# Patient Record
Sex: Male | Born: 1945
Health system: Southern US, Community
[De-identification: ages and names within clinical notes are randomized; demographics above are authoritative.]

## PROBLEM LIST (undated history)

## (undated) DIAGNOSIS — E669 Obesity, unspecified: Secondary | ICD-10-CM

## (undated) DIAGNOSIS — K219 Gastro-esophageal reflux disease without esophagitis: Secondary | ICD-10-CM

## (undated) DIAGNOSIS — E079 Disorder of thyroid, unspecified: Secondary | ICD-10-CM

## (undated) DIAGNOSIS — I2699 Other pulmonary embolism without acute cor pulmonale: Secondary | ICD-10-CM

---

## 2000-02-01 ENCOUNTER — Encounter (INDEPENDENT_AMBULATORY_CARE_PROVIDER_SITE_OTHER): Payer: Self-pay | Admitting: Specialist

## 2000-02-01 ENCOUNTER — Encounter: Payer: Self-pay | Admitting: General Surgery

## 2000-02-01 ENCOUNTER — Encounter: Payer: Self-pay | Admitting: *Deleted

## 2000-02-01 ENCOUNTER — Observation Stay (HOSPITAL_COMMUNITY): Admission: RE | Admit: 2000-02-01 | Discharge: 2000-02-02 | Payer: Self-pay | Admitting: General Surgery

## 2010-10-09 ENCOUNTER — Ambulatory Visit
Admission: RE | Admit: 2010-10-09 | Discharge: 2010-10-15 | Payer: Self-pay | Source: Home / Self Care | Attending: Radiation Oncology | Admitting: Radiation Oncology

## 2010-10-12 ENCOUNTER — Encounter
Admission: RE | Admit: 2010-10-12 | Discharge: 2010-10-12 | Payer: Self-pay | Source: Home / Self Care | Attending: Urology | Admitting: Urology

## 2010-10-23 ENCOUNTER — Ambulatory Visit (HOSPITAL_BASED_OUTPATIENT_CLINIC_OR_DEPARTMENT_OTHER)
Admission: RE | Admit: 2010-10-23 | Discharge: 2010-10-23 | Disposition: A | Discharge status: Home / Self Care | Payer: BC Managed Care – PPO | Source: Ambulatory Visit | Attending: Urology | Admitting: Urology

## 2010-10-23 DIAGNOSIS — Z01810 Encounter for preprocedural cardiovascular examination: Secondary | ICD-10-CM | POA: Insufficient documentation

## 2010-10-23 DIAGNOSIS — Z538 Procedure and treatment not carried out for other reasons: Secondary | ICD-10-CM | POA: Insufficient documentation

## 2010-12-08 LAB — PROTIME-INR
INR: 1 (ref 0.00–1.49)
Prothrombin Time: 13.4 seconds (ref 11.6–15.2)

## 2010-12-08 LAB — COMPREHENSIVE METABOLIC PANEL
BUN: 11 mg/dL (ref 6–23)
CO2: 28 mEq/L (ref 19–32)
Calcium: 9.1 mg/dL (ref 8.4–10.5)
Creatinine, Ser: 0.86 mg/dL (ref 0.4–1.5)
GFR calc Af Amer: >60 mL/min (ref >60)
GFR calc non Af Amer: >60 mL/min (ref >60)
Glucose, Bld: 96 mg/dL (ref 70–99)

## 2010-12-08 LAB — CBC
HCT: 46 % (ref 39.0–52.0)
Hemoglobin: 15.2 g/dL (ref 13.0–17.0)
MCH: 30.3 pg (ref 26.0–34.0)
MCHC: 33 g/dL (ref 30.0–36.0)
MCV: 91.8 fL (ref 78.0–100.0)
Platelets: 298 10*3/uL (ref 150–400)
RBC: 5.01 MIL/uL (ref 4.22–5.81)
RDW: 13.9 % (ref 11.5–15.5)
WBC: 8.1 10*3/uL (ref 4.0–10.5)

## 2010-12-08 LAB — APTT: aPTT: 32 seconds (ref 24–37)

## 2010-12-12 ENCOUNTER — Ambulatory Visit (HOSPITAL_COMMUNITY): Payer: BC Managed Care – PPO | Attending: Urology

## 2010-12-12 ENCOUNTER — Ambulatory Visit
Admission: RE | Admit: 2010-12-12 | Discharge: 2010-12-12 | Disposition: A | Discharge status: Home / Self Care | Payer: BC Managed Care – PPO | Source: Ambulatory Visit | Attending: Urology | Admitting: Urology

## 2010-12-12 ENCOUNTER — Ambulatory Visit: Payer: BC Managed Care – PPO | Attending: Radiation Oncology | Admitting: Radiation Oncology

## 2010-12-12 DIAGNOSIS — Z01812 Encounter for preprocedural laboratory examination: Secondary | ICD-10-CM | POA: Insufficient documentation

## 2010-12-12 DIAGNOSIS — Z79899 Other long term (current) drug therapy: Secondary | ICD-10-CM | POA: Insufficient documentation

## 2010-12-12 DIAGNOSIS — C61 Malignant neoplasm of prostate: Secondary | ICD-10-CM | POA: Insufficient documentation

## 2011-01-02 ENCOUNTER — Ambulatory Visit: Payer: BC Managed Care – PPO | Admitting: Radiation Oncology

## 2011-01-10 ENCOUNTER — Ambulatory Visit: Payer: BC Managed Care – PPO | Admitting: Radiation Oncology

## 2011-01-10 ENCOUNTER — Ambulatory Visit: Payer: BC Managed Care – PPO | Attending: Radiation Oncology | Admitting: Radiation Oncology

## 2011-01-14 ENCOUNTER — Ambulatory Visit: Payer: BC Managed Care – PPO | Admitting: Radiation Oncology

## 2011-02-01 NOTE — Op Note (Signed)
[facility]  Patient:    Patrick Osborn, Patrick Osborn                       MRN: [id number] Proc. Date: 02/01/00 Adm. Date:  [id number] Disc. Date: [id number] Attending:  Carson Myrtle                           Operative Report  PREOPERATIVE DIAGNOSIS:  Umbilical hernia incarcerated, chronic cholecystolithiasis.  POSTOPERATIVE DIAGNOSIS:  Umbilical hernia incarcerated, chronic cholecystolithiasis.  OPERATION PERFORMED:  Laparoscopic cholecystectomy and repair of umbilical hernia.  SURGEON:  Timothy E. Earlene Plater, M.D.  ASSISTANT:  Marnee Spring. Wiliam Ke, M.D.  ANESTHESIA:  CRNA supervised M.D.  INDICATION FOR PROCEDURE:  Mr. Hayhurst considers himself otherwise healthy at 38. He takes no medications of drugs. He has had a recent onset of mid epigastric and right upper quadrant pain. Ultrasound shows gallstones and laboratory data have all been normal. He has electively selected this time to proceed with lap chole which has been carefully explained. He also has an incarcerated umbilical hernia that not only needs repair but will be in the way of the laparoscopic cholecystectomy.  DESCRIPTION OF PROCEDURE:  The patient was brought to the operating room, placed supine, general endotracheal anesthesia administered. The abdomen was shaved, scrubbed, prepped and draped in the usual fashion. Marcaine 0.5% with epinephrine was injected at each trocar site. A large infraumbilical curvilinear incision made. I dissected the hernia superior, entered the fascia inferior and placed the Hasson catheter under direct vision without complications. This was tied in place and the abdomen was insufflated. General peritoneoscopy was unremarkable except for a very obese omentum. A second 10 mm trocar was placed in the mid epigastrium and two 5 mm trocars in the right upper quadrant. Appropriate instruments applied, the gallbladder grasped and placed on tension. It was a very fatty gallbladder  and partially intrahepatic. Careful dissection in the fatty portion of the lower part of the gallbladder very carefully and bluntly revealed a normal appearing cystic duct, a normal appearing cystic artery and the outlying of the posterior aspect of the gallbladder. The two structures were dissected out, triply clipped and divided and then the gallbladder was removed from the gallbladder bed without difficulty though 1 small hole was made in the posterior aspect of the gallbladder where it was intrahepatic. The bed was carefully controlled with cautery and the bed was dry. Irrigation was then clear. The gallbladder was then placed in a bag and then removed from the abdomen through the infraumbilical incision. Irrigation carried out until clear. All irrigation instruments and trocars removed. The infraumbilical incision was then expanded. The umbilical skin was dissected off the hernia defect, the hernia defect was reduced and the fascia was closed with interrupted #0 Prolene sutures. This closed both the hernia and the trocar site. That wound was dry as well. The skin was closed with 4-0 monocryl and Steri-Strips were applied. Counts correct. The patient tolerated the procedure well and was removed to the recovery room in good condition. DD:  02/01/00 TD:  02/06/00 Job: [id number]

## 2013-04-25 DIAGNOSIS — I2699 Other pulmonary embolism without acute cor pulmonale: Secondary | ICD-10-CM | POA: Insufficient documentation

## 2013-07-26 DIAGNOSIS — I82409 Acute embolism and thrombosis of unspecified deep veins of unspecified lower extremity: Secondary | ICD-10-CM | POA: Insufficient documentation

## 2013-08-18 DIAGNOSIS — E669 Obesity, unspecified: Secondary | ICD-10-CM | POA: Insufficient documentation

## 2013-08-18 DIAGNOSIS — E039 Hypothyroidism, unspecified: Secondary | ICD-10-CM | POA: Insufficient documentation

## 2013-08-18 DIAGNOSIS — K219 Gastro-esophageal reflux disease without esophagitis: Secondary | ICD-10-CM | POA: Insufficient documentation

## 2015-10-24 DIAGNOSIS — R0600 Dyspnea, unspecified: Secondary | ICD-10-CM | POA: Diagnosis not present

## 2015-10-24 DIAGNOSIS — R5383 Other fatigue: Secondary | ICD-10-CM | POA: Diagnosis not present

## 2015-10-24 DIAGNOSIS — G4733 Obstructive sleep apnea (adult) (pediatric): Secondary | ICD-10-CM | POA: Diagnosis not present

## 2015-11-10 DIAGNOSIS — Z7901 Long term (current) use of anticoagulants: Secondary | ICD-10-CM | POA: Diagnosis not present

## 2015-12-05 DIAGNOSIS — G4733 Obstructive sleep apnea (adult) (pediatric): Secondary | ICD-10-CM | POA: Diagnosis not present

## 2015-12-05 DIAGNOSIS — R0600 Dyspnea, unspecified: Secondary | ICD-10-CM | POA: Diagnosis not present

## 2015-12-05 DIAGNOSIS — R5383 Other fatigue: Secondary | ICD-10-CM | POA: Diagnosis not present

## 2016-01-01 DIAGNOSIS — E039 Hypothyroidism, unspecified: Secondary | ICD-10-CM | POA: Diagnosis not present

## 2016-01-01 DIAGNOSIS — Z79899 Other long term (current) drug therapy: Secondary | ICD-10-CM | POA: Diagnosis not present

## 2016-01-01 DIAGNOSIS — B9689 Other specified bacterial agents as the cause of diseases classified elsewhere: Secondary | ICD-10-CM | POA: Diagnosis not present

## 2016-01-01 DIAGNOSIS — J019 Acute sinusitis, unspecified: Secondary | ICD-10-CM | POA: Diagnosis not present

## 2016-01-01 DIAGNOSIS — R251 Tremor, unspecified: Secondary | ICD-10-CM | POA: Diagnosis not present

## 2016-01-05 DIAGNOSIS — D485 Neoplasm of uncertain behavior of skin: Secondary | ICD-10-CM | POA: Diagnosis not present

## 2016-01-05 DIAGNOSIS — R5383 Other fatigue: Secondary | ICD-10-CM | POA: Diagnosis not present

## 2016-01-05 DIAGNOSIS — R0600 Dyspnea, unspecified: Secondary | ICD-10-CM | POA: Diagnosis not present

## 2016-01-05 DIAGNOSIS — G4733 Obstructive sleep apnea (adult) (pediatric): Secondary | ICD-10-CM | POA: Diagnosis not present

## 2016-01-05 DIAGNOSIS — L820 Inflamed seborrheic keratosis: Secondary | ICD-10-CM | POA: Diagnosis not present

## 2016-01-05 DIAGNOSIS — L918 Other hypertrophic disorders of the skin: Secondary | ICD-10-CM | POA: Diagnosis not present

## 2016-01-11 DIAGNOSIS — R1010 Upper abdominal pain, unspecified: Secondary | ICD-10-CM | POA: Diagnosis not present

## 2016-01-11 DIAGNOSIS — R109 Unspecified abdominal pain: Secondary | ICD-10-CM | POA: Diagnosis not present

## 2016-01-11 DIAGNOSIS — Z888 Allergy status to other drugs, medicaments and biological substances status: Secondary | ICD-10-CM | POA: Diagnosis not present

## 2016-01-12 DIAGNOSIS — R1010 Upper abdominal pain, unspecified: Secondary | ICD-10-CM | POA: Diagnosis not present

## 2016-01-15 DIAGNOSIS — R1084 Generalized abdominal pain: Secondary | ICD-10-CM | POA: Diagnosis not present

## 2016-01-15 DIAGNOSIS — K529 Noninfective gastroenteritis and colitis, unspecified: Secondary | ICD-10-CM | POA: Diagnosis not present

## 2016-01-15 DIAGNOSIS — D485 Neoplasm of uncertain behavior of skin: Secondary | ICD-10-CM | POA: Diagnosis not present

## 2016-02-04 DIAGNOSIS — R5383 Other fatigue: Secondary | ICD-10-CM | POA: Diagnosis not present

## 2016-02-04 DIAGNOSIS — R0600 Dyspnea, unspecified: Secondary | ICD-10-CM | POA: Diagnosis not present

## 2016-02-04 DIAGNOSIS — G4733 Obstructive sleep apnea (adult) (pediatric): Secondary | ICD-10-CM | POA: Diagnosis not present

## 2016-03-06 DIAGNOSIS — G4733 Obstructive sleep apnea (adult) (pediatric): Secondary | ICD-10-CM | POA: Diagnosis not present

## 2016-03-06 DIAGNOSIS — R5383 Other fatigue: Secondary | ICD-10-CM | POA: Diagnosis not present

## 2016-03-06 DIAGNOSIS — R0600 Dyspnea, unspecified: Secondary | ICD-10-CM | POA: Diagnosis not present

## 2016-03-28 DIAGNOSIS — Z7901 Long term (current) use of anticoagulants: Secondary | ICD-10-CM | POA: Diagnosis not present

## 2016-03-28 DIAGNOSIS — R600 Localized edema: Secondary | ICD-10-CM | POA: Diagnosis not present

## 2016-04-05 DIAGNOSIS — G4733 Obstructive sleep apnea (adult) (pediatric): Secondary | ICD-10-CM | POA: Diagnosis not present

## 2016-04-05 DIAGNOSIS — R0600 Dyspnea, unspecified: Secondary | ICD-10-CM | POA: Diagnosis not present

## 2016-04-05 DIAGNOSIS — R5383 Other fatigue: Secondary | ICD-10-CM | POA: Diagnosis not present

## 2016-04-22 DIAGNOSIS — Z7901 Long term (current) use of anticoagulants: Secondary | ICD-10-CM | POA: Diagnosis not present

## 2016-04-25 DIAGNOSIS — R600 Localized edema: Secondary | ICD-10-CM | POA: Diagnosis not present

## 2016-04-25 DIAGNOSIS — Z86718 Personal history of other venous thrombosis and embolism: Secondary | ICD-10-CM | POA: Diagnosis not present

## 2016-04-26 DIAGNOSIS — M25559 Pain in unspecified hip: Secondary | ICD-10-CM | POA: Diagnosis not present

## 2016-04-26 DIAGNOSIS — M179 Osteoarthritis of knee, unspecified: Secondary | ICD-10-CM | POA: Diagnosis not present

## 2016-04-26 DIAGNOSIS — R600 Localized edema: Secondary | ICD-10-CM | POA: Diagnosis not present

## 2016-04-26 DIAGNOSIS — M25569 Pain in unspecified knee: Secondary | ICD-10-CM | POA: Diagnosis not present

## 2016-04-26 DIAGNOSIS — M25551 Pain in right hip: Secondary | ICD-10-CM | POA: Diagnosis not present

## 2016-04-30 ENCOUNTER — Emergency Department (HOSPITAL_COMMUNITY)
Admission: EM | Admit: 2016-04-30 | Discharge: 2016-04-30 | Disposition: A | Discharge status: Home / Self Care | Payer: PPO | Attending: Physician Assistant | Admitting: Physician Assistant

## 2016-04-30 ENCOUNTER — Encounter (HOSPITAL_COMMUNITY): Payer: Self-pay | Admitting: Emergency Medicine

## 2016-04-30 ENCOUNTER — Emergency Department (HOSPITAL_COMMUNITY): Payer: PPO

## 2016-04-30 ENCOUNTER — Emergency Department (HOSPITAL_COMMUNITY)
Admit: 2016-04-30 | Discharge: 2016-04-30 | Disposition: A | Discharge status: Home / Self Care | Payer: PPO | Attending: Physician Assistant | Admitting: Physician Assistant

## 2016-04-30 DIAGNOSIS — M79661 Pain in right lower leg: Secondary | ICD-10-CM | POA: Diagnosis not present

## 2016-04-30 DIAGNOSIS — R0602 Shortness of breath: Secondary | ICD-10-CM | POA: Insufficient documentation

## 2016-04-30 DIAGNOSIS — Z7901 Long term (current) use of anticoagulants: Secondary | ICD-10-CM | POA: Insufficient documentation

## 2016-04-30 DIAGNOSIS — R600 Localized edema: Secondary | ICD-10-CM | POA: Diagnosis not present

## 2016-04-30 DIAGNOSIS — R0789 Other chest pain: Secondary | ICD-10-CM | POA: Diagnosis not present

## 2016-04-30 HISTORY — DX: Disorder of thyroid, unspecified: E07.9

## 2016-04-30 HISTORY — DX: Other pulmonary embolism without acute cor pulmonale: I26.99

## 2016-04-30 HISTORY — DX: Gastro-esophageal reflux disease without esophagitis: K21.9

## 2016-04-30 HISTORY — DX: Obesity, unspecified: E66.9

## 2016-04-30 LAB — COMPREHENSIVE METABOLIC PANEL
ALK PHOS: 69 U/L (ref 38–126)
ALT: 22 U/L (ref 17–63)
AST: 22 U/L (ref 15–41)
Albumin: 3.3 g/dL — ABNORMAL LOW (ref 3.5–5.0)
Anion gap: 7 (ref 5–15)
BUN: 10 mg/dL (ref 6–20)
CALCIUM: 9 mg/dL (ref 8.9–10.3)
CHLORIDE: 107 mmol/L (ref 101–111)
CO2: 24 mmol/L (ref 22–32)
CREATININE: 1.03 mg/dL (ref 0.61–1.24)
Glucose, Bld: 115 mg/dL — ABNORMAL HIGH (ref 65–99)
Potassium: 4.1 mmol/L (ref 3.5–5.1)
Sodium: 138 mmol/L (ref 135–145)
Total Bilirubin: 0.6 mg/dL (ref 0.3–1.2)
Total Protein: 6.3 g/dL — ABNORMAL LOW (ref 6.5–8.1)

## 2016-04-30 LAB — PROTIME-INR
INR: 3.1
Prothrombin Time: 32.6 seconds — ABNORMAL HIGH (ref 11.4–15.2)

## 2016-04-30 LAB — BRAIN NATRIURETIC PEPTIDE: B NATRIURETIC PEPTIDE 5: 36.5 pg/mL (ref 0.0–100.0)

## 2016-04-30 LAB — CBC WITH DIFFERENTIAL/PLATELET
BASOS PCT: 1 %
Basophils Absolute: 0.1 10*3/uL (ref 0.0–0.1)
EOS ABS: 0.1 10*3/uL (ref 0.0–0.7)
EOS PCT: 1 %
HCT: 45.2 % (ref 39.0–52.0)
HEMOGLOBIN: 14.8 g/dL (ref 13.0–17.0)
LYMPHS ABS: 1.3 10*3/uL (ref 0.7–4.0)
Lymphocytes Relative: 16 %
MCH: 29.9 pg (ref 26.0–34.0)
MCHC: 32.7 g/dL (ref 30.0–36.0)
MCV: 91.3 fL (ref 78.0–100.0)
MONOS PCT: 7 %
Monocytes Absolute: 0.5 10*3/uL (ref 0.1–1.0)
NEUTROS PCT: 75 %
Neutro Abs: 6.1 10*3/uL (ref 1.7–7.7)
PLATELETS: 285 10*3/uL (ref 150–400)
RBC: 4.95 MIL/uL (ref 4.22–5.81)
RDW: 14.4 % (ref 11.5–15.5)
WBC: 8.1 10*3/uL (ref 4.0–10.5)

## 2016-04-30 LAB — I-STAT TROPONIN, ED: TROPONIN I, POC: 0.01 ng/mL (ref 0.00–0.08)

## 2016-04-30 MED ORDER — IOPAMIDOL (ISOVUE-370) INJECTION 76%
INTRAVENOUS | Status: AC
  Administered 2016-04-30: 100 mL
  Filled 2016-04-30: qty 100

## 2016-04-30 NOTE — ED Notes (Signed)
Pt voided in urinal 200ccs.

## 2016-04-30 NOTE — Progress Notes (Signed)
VASCULAR LAB PRELIMINARY  PRELIMINARY  PRELIMINARY  PRELIMINARY  Right lower extremity venous duplex completed.    Preliminary report:  Right:  No evidence of DVT, superficial thrombosis, or Baker's cyst. Enlargement of the inguinal lymph nodes. Mild interstitial fluid throughout the lower leg.  Anaija Wissink, RVS 04/30/2016, 1:43 PM

## 2016-04-30 NOTE — ED Notes (Signed)
Ambulated Pt with pulse ox. Pt's SpO2 remained around 96% and pulse between 71-77 bpm. Pt did not feel dizzy.

## 2016-04-30 NOTE — Discharge Instructions (Signed)
We are unsure what is causing your right leg swelling. Or your shortness of breath. We have ruled out a pulmonary embolism or blood clot today. Please follow-up with a cardiologist and pulmonologist. We have given you the phone number for each group. In addition you should follow up with her primary care physician to let them know of your symptoms. Please return with any concerns.

## 2016-04-30 NOTE — ED Notes (Signed)
EDP at bedside  

## 2016-04-30 NOTE — ED Notes (Signed)
Pt transported to Vascular by vascular tech

## 2016-04-30 NOTE — ED Provider Notes (Signed)
[facility] DEPT Provider Note   CSN: [id number] Arrival date & time: 04/30/16  1039     History   Chief Complaint Chief Complaint  Patient presents with  . Leg Pain    HPI Patrick Osborn is a 70 y.o. male.  The history is provided by the patient and the spouse.  Leg Pain   The current episode started more than 2 days ago. The problem occurs constantly. The problem has not changed since onset.The pain is present in the right lower leg. The quality of the pain is described as aching. The pain is at a severity of 2/10. The pain is mild. Pertinent negatives include no numbness, full range of motion, no stiffness and no tingling. The symptoms are aggravated by standing. He has tried OTC pain medications and rest for the symptoms. The treatment provided mild relief. There has been no history of extremity trauma.  Shortness of Breath  This is a new problem. The problem occurs frequently.The current episode started more than 2 days ago. The problem has been gradually worsening. Associated symptoms include chest pain, leg pain and leg swelling. Pertinent negatives include no cough and no abdominal pain. Risk factors include recent prolonged sitting.     Patient  Past Medical History:  Diagnosis Date  . GERD (gastroesophageal reflux disease)   . Obesity   . PE (pulmonary thromboembolism) ([place])   . Thyroid disease     There are no active problems to display for this patient.   No past surgical history on file.     Home Medications    Prior to Admission medications   Medication Sig Start Date End Date Taking? Authorizing Provider  furosemide (LASIX) 20 MG tablet Take 20 mg by mouth daily.   Yes Historical Provider, MD  levothyroxine (SYNTHROID, LEVOTHROID) 25 MCG tablet Take 25 mcg by mouth daily before breakfast.   Yes Historical Provider, MD  pantoprazole (PROTONIX) 40 MG tablet Take 40 mg by mouth daily.   Yes Historical Provider, MD  warfarin (COUMADIN) 1 MG tablet Take  7-7.5 mg by mouth See admin instructions. Take 7 mg by mouth daily alternating with 7.5 mg by mouth every other day. Take with 6mg  to equal correct dose.   Yes Historical Provider, MD  warfarin (COUMADIN) 6 MG tablet Take 7-7.5 mg by mouth See admin instructions. Take 7 mg by mouth daily alternating with 7.5 mg by mouth every other day. Take with 1mg  to equal correct dose.   Yes Historical Provider, MD    Family History No family history on file.  Social History Social History  Substance Use Topics  . Smoking status: Never Smoker  . Smokeless tobacco: Never Used  . Alcohol use Not on file     Allergies   Review of patient's allergies indicates no known allergies.   Review of Systems Review of Systems  Constitutional: Negative for activity change.  Respiratory: Positive for chest tightness and shortness of breath. Negative for cough.   Cardiovascular: Positive for chest pain and leg swelling.  Gastrointestinal: Negative for abdominal pain.  Musculoskeletal: Negative for stiffness.  Neurological: Negative for tingling and numbness.  All other systems reviewed and are negative.    Physical Exam Updated Vital Signs BP 136/76 (BP Location: Right Arm)   Pulse 94   Temp 98 F (36.7 C) (Oral)   Resp 18   Ht 5\' 9"  (1.753 m)   Wt 263 lb (119.3 kg)   SpO2 97%   BMI 38.84 kg/m  Physical Exam  Constitutional: He appears well-developed and well-nourished.  Obese elderly man.  HENT:  Head: Normocephalic and atraumatic.  Eyes: Conjunctivae are normal.  Neck: Neck supple.  Cardiovascular: Normal rate and regular rhythm.   No murmur heard. Pulmonary/Chest: Effort normal and breath sounds normal. No respiratory distress. He exhibits no tenderness.  Abdominal: Soft. There is no tenderness.  Musculoskeletal: He exhibits edema.  Right leg +2 pitting edema left leg trace.  Neurological: He is alert. No cranial nerve deficit.  Skin: Skin is warm and dry.  Psychiatric: He has a  normal mood and affect.  Nursing note and vitals reviewed.    ED Treatments / Results  Labs (all labs ordered are listed, but only abnormal results are displayed) Labs Reviewed  COMPREHENSIVE METABOLIC PANEL - Abnormal; Notable for the following:       Result Value   Glucose, Bld 115 (*)    Total Protein 6.3 (*)    Albumin 3.3 (*)    All other components within normal limits  PROTIME-INR - Abnormal; Notable for the following:    Prothrombin Time 32.6 (*)    All other components within normal limits  CBC WITH DIFFERENTIAL/PLATELET  BRAIN NATRIURETIC PEPTIDE  I-STAT TROPOININ, ED    EKG  EKG Interpretation  Date/Time:  Tuesday April 30 2016 10:59:36 EDT Ventricular Rate:  69 PR Interval:    QRS Duration: 140 QT Interval:  418 QTC Calculation: 448 R Axis:   14 Text Interpretation:  Sinus rhythm Right bundle branch block No significant change since last tracing Confirmed by Gerald Leitz [id number]) on 04/30/2016 12:05:45 PM Also confirmed by Gerald Leitz ([id number]), editor Lorenda Cahill CT, Leda Gauze [id number])  on 04/30/2016 12:12:09 PM       Radiology Ct Angio Chest Pe W And/or Wo Contrast  Result Date: 04/30/2016 CLINICAL DATA:  Right leg pain and swelling. Shortness of breath. History of pulmonary embolism. EXAM: CT ANGIOGRAPHY CHEST WITH CONTRAST TECHNIQUE: Multidetector CT imaging of the chest was performed using the standard protocol during bolus administration of intravenous contrast. Multiplanar CT image reconstructions and MIPs were obtained to evaluate the vascular anatomy. CONTRAST:  100 mL Isovue 370 COMPARISON:  04/24/2013 FINDINGS: Mediastinum/Lymph Nodes: Pulmonary arterial opacification is adequate without evidence of emboli, although subsegmental evaluation is limited by motion artifact in the lower lungs. The thoracic aorta is normal in caliber. No masses or pathologically enlarged lymph nodes identified. Lungs/Pleura: No pleural effusion. Respiratory motion artifact with  mild atelectasis in the lung bases. Two 5 mm nodules in the right lower lobe are unchanged (series 5, images 95 and 97). 3 mm radiopaque foreign body posteriorly in the left lower lobe is unchanged. Major airways are patent. Upper abdomen: Prior cholecystectomy. Musculoskeletal: Thoracic spondylosis. No acute osseous abnormality identified. Review of the MIP images confirms the above findings. IMPRESSION: 1. No evidence of pulmonary emboli or other acute abnormality in the chest. 2. Small right lower lobe lung nodules, unchanged from 2014 and considered benign. Electronically Signed   By: Logan Bores M.D.   On: 04/30/2016 14:08    Procedures Procedures (including critical care time)  Medications Ordered in ED Medications  iopamidol (ISOVUE-370) 76 % injection (100 mLs  Contrast Given 04/30/16 1334)     Initial Impression / Assessment and Plan / ED Course  I have reviewed the triage vital signs and the nursing notes.  Pertinent labs & imaging results that were available during my care of the patient were reviewed by me and considered in my medical  decision making (see chart for details).  Clinical Course    Patient is a 70 year old male with history of pulmonary embolism. Patient had prolonged hospitalization at [facility] in August 2011. At that time he had bilateral pulmonary emboli, filter, retreival of filter complicated by some type of tear, multiple hematoma.  He is presenting today with 1 week of right leg swelling worse than left leg. Bilateral legs have been swelling however. He's also has shortness of breath with exertion.  Patient on Coumadin, had negative ultrasound last week. However has had persistent and worsening right leg swelling.  We will get a CT PE, repeat ultrasound of right lower extremity.  2:58 PM  CT PE shows no evidence of pulmonary embolism. No evidence of DVT on ultrasound.  Patient was on 100% on ambulatory pulse ox. We will give patient  follow up with cardiology and pulmonology.  This could be chronic due to previous pulmonary embolism and DVT, versus low albumin state versus other etiology for chronic edema. We will have him pursue workup as an outpatient.  Final Clinical Impressions(s) / ED Diagnoses   Final diagnoses:  None    New Prescriptions New Prescriptions   No medications on file     Courteney Julio Alm, MD 04/30/16 1458

## 2016-04-30 NOTE — ED Triage Notes (Signed)
Leg swelling x 1 month has hx of PE, now rtt leg is welling more ,  No cp or sob. He states  Is still on blood thinners. Pt aaox4

## 2016-05-06 DIAGNOSIS — G4733 Obstructive sleep apnea (adult) (pediatric): Secondary | ICD-10-CM | POA: Diagnosis not present

## 2016-05-06 DIAGNOSIS — R0600 Dyspnea, unspecified: Secondary | ICD-10-CM | POA: Diagnosis not present

## 2016-05-06 DIAGNOSIS — R5383 Other fatigue: Secondary | ICD-10-CM | POA: Diagnosis not present

## 2016-05-10 DIAGNOSIS — Z7901 Long term (current) use of anticoagulants: Secondary | ICD-10-CM | POA: Diagnosis not present

## 2016-05-24 DIAGNOSIS — R609 Edema, unspecified: Secondary | ICD-10-CM | POA: Diagnosis not present

## 2016-05-24 DIAGNOSIS — I272 Other secondary pulmonary hypertension: Secondary | ICD-10-CM | POA: Diagnosis not present

## 2016-06-06 DIAGNOSIS — R0600 Dyspnea, unspecified: Secondary | ICD-10-CM | POA: Diagnosis not present

## 2016-06-06 DIAGNOSIS — G4733 Obstructive sleep apnea (adult) (pediatric): Secondary | ICD-10-CM | POA: Diagnosis not present

## 2016-06-06 DIAGNOSIS — R5383 Other fatigue: Secondary | ICD-10-CM | POA: Diagnosis not present

## 2016-06-17 NOTE — Progress Notes (Signed)
Cardiology Office Note   Date:  06/18/2016   ID:  Patrick Osborn, DOB 01-01-1946, MRN [id number]  PCP:  No PCP Per Patient  Cardiologist:   Skeet Latch, MD   Chief Complaint  Patient presents with  . New Patient (Initial Visit)    cramping in legs and edema; in ankles and legs.     History of Present Illness: Patrick Osborn is a 70 y.o. male with GERD and prior PE who presents for an evaluation of shortness of breath and edema.  Patrick Osborn was seen in the ED 04/30/16 with leg pain and shortness of breath.  He had lower extremity Dopplers that were negative for DVT.  He followed up with his PCP, Dr. Ann Held, on 05/24/16 and was referred to cardiology for evaluation.  A BNP was checked at that appointment and was 43.7.  He reports swelling that has been ongoing for the last three years.  The symptoms began after he was admitted to [facility] in 2015 for a pulmonary embolism.  He doesn't recall being told that he had a DVT at that time.  He was critically ill and spent over 30 days in the hospital.  He has remained on warfarin since that time.  He reports that that hospitalization was complicated by an IVC filter that was implanted and later replaced due to migration.  He also had a retroperitoneal hematoma.    Patrick Osborn states that the swelling has been worse in the right leg than the left leg.  He endorses exertional dyspnea that also occurred at the time of his PE.  He denies chest pain, orthopnea or PND.  He has tried wearing compression socks but they are too painful.  He does not watch his salt intake or elevate his legs.  He notes that the swelling does improve somewhat in the morning and then gets worse throughout the day.  He has lasix but doesn't take it daily.   Past Medical History:  Diagnosis Date  . GERD (gastroesophageal reflux disease)   . Obesity   . PE (pulmonary thromboembolism) ([place])   . Thyroid disease     No past surgical history on file.   Current Outpatient  Prescriptions  Medication Sig Dispense Refill  . furosemide (LASIX) 20 MG tablet Take 20 mg by mouth daily.    Marland Kitchen levothyroxine (SYNTHROID, LEVOTHROID) 25 MCG tablet Take 25 mcg by mouth daily before breakfast.    . pantoprazole (PROTONIX) 40 MG tablet Take 40 mg by mouth daily.    Marland Kitchen warfarin (COUMADIN) 1 MG tablet Take 7-7.5 mg by mouth See admin instructions. Take 7 mg by mouth daily alternating with 7.5 mg by mouth every other day. Take with 6mg  to equal correct dose.    . warfarin (COUMADIN) 6 MG tablet Take 7-7.5 mg by mouth See admin instructions. Take 7 mg by mouth daily alternating with 7.5 mg by mouth every other day. Take with 1mg  to equal correct dose.     No current facility-administered medications for this visit.     Allergies:   Review of patient's allergies indicates no known allergies.    Social History:  The patient  reports that he has never smoked. He has never used smokeless tobacco. He reports that he does not drink alcohol or use drugs.   Family History:  The patient's family history includes Diabetes in his sister; Stroke in his father and mother.    ROS:  Please see the history of  present illness.   Otherwise, review of systems are positive for none.   All other systems are reviewed and negative.    PHYSICAL EXAM: VS:  BP 124/79   Pulse 68   Ht 5\' 10"  (1.778 m)   Wt 266 lb 6.4 oz (120.8 kg)   BMI 38.22 kg/m  , BMI Body mass index is 38.22 kg/m. GENERAL:  Well appearing HEENT:  Pupils equal round and reactive, fundi not visualized, oral mucosa unremarkable NECK:  No jugular venous distention, waveform within normal limits, carotid upstroke brisk and symmetric, no bruits, no thyromegaly LYMPHATICS:  No cervical adenopathy LUNGS:  Clear to auscultation bilaterally HEART:  RRR.  PMI not displaced or sustained,S1 and S2 within normal limits, no S3, no S4, no clicks, no rubs, no murmurs ABD:  Flat, positive bowel sounds normal in frequency in pitch, no bruits, no  rebound, no guarding, no midline pulsatile mass, no hepatomegaly, no splenomegaly EXT:  2 plus pulses throughout, 2+ pitting edema on the R to above the knee.  1+ pitting edema on the left to the upper tibia, no cyanosis no clubbing SKIN:  No rashes no nodules NEURO:  Cranial nerves II through XII grossly intact, motor grossly intact throughout PSYCH:  Cognitively intact, oriented to person place and time   EKG:  EKG is ordered today. The ekg ordered today demonstrates sinus rhythm rate 68 bpm.    Recent Labs: 04/30/2016: ALT 22; B Natriuretic Peptide 36.5; BUN 10; Creatinine, Ser 1.03; Hemoglobin 14.8; Platelets 285; Potassium 4.1; Sodium 138   05/24/16: Sodium 140, potassium 4.2, BUN 10, creatinine 1.0 TSH 0.84, free T4 0.95 BNP 43.7  Lipid Panel No results found for: CHOL, TRIG, HDL, CHOLHDL, VLDL, LDLCALC, LDLDIRECT    Wt Readings from Last 3 Encounters:  06/18/16 266 lb 6.4 oz (120.8 kg)  04/30/16 263 lb (119.3 kg)      ASSESSMENT AND PLAN:  # Lower extremity edema: This is likely due to venous insufficiency from his prior pulmonary embolism. He has not tolerated compression stockings in the past. I recommended that he work on limiting his salt intake and elevate his legs whenever sitting. We will obtain an echocardiogram to evaluate for right heart failure. He does not appear to be in heart failure on exam and had a normal BNP last month. He has difficulty with diuretics due to urinary incontinence.  # Shortness of breath:  Patrick Osborn reports dyspnea with minimal exertion. I suspect that this is due to obesity and deconditioning. However, we will obtain an echo as above as well as a Lexiscan Myoview to evaluate for ischemia.   Current medicines are reviewed at length with the patient today.  The patient does not have concerns regarding medicines.  The following changes have been made:  no change  Labs/ tests ordered today include:   Orders Placed This Encounter    Procedures  . Myocardial Perfusion Imaging  . EKG 12-Lead  . ECHOCARDIOGRAM COMPLETE     Disposition:   FU with Rumaysa Sabatino C. Oval Linsey, MD, [facility] in 1 month    This note was written with the assistance of speech recognition software.  Please excuse any transcriptional errors.  Signed, Wladyslawa Disbro C. Oval Linsey, MD, [facility]  06/18/2016 1:28 PM    [facility]

## 2016-06-18 ENCOUNTER — Encounter: Payer: Self-pay | Admitting: Cardiovascular Disease

## 2016-06-18 ENCOUNTER — Ambulatory Visit (INDEPENDENT_AMBULATORY_CARE_PROVIDER_SITE_OTHER): Payer: PPO | Admitting: Cardiovascular Disease

## 2016-06-18 VITALS — BP 124/79 | HR 68 | Ht 70.0 in | Wt 266.4 lb

## 2016-06-18 DIAGNOSIS — R0602 Shortness of breath: Secondary | ICD-10-CM | POA: Diagnosis not present

## 2016-06-18 DIAGNOSIS — R600 Localized edema: Secondary | ICD-10-CM

## 2016-06-18 NOTE — Patient Instructions (Addendum)
Medication Instructions:  Your physician recommends that you continue on your current medications as directed. Please refer to the Current Medication list given to you today.  Labwork: none  Testing/Procedures: Your physician has requested that you have an echocardiogram. Echocardiography is a painless test that uses sound waves to create images of your heart. It provides your doctor with information about the size and shape of your heart and how well your heart's chambers and valves are working. This procedure takes approximately one hour. There are no restrictions for this procedure. [address]  Your physician has requested that you have a lexiscan myoview. For further information please visit [email or web address]. Please follow instruction sheet, as given.  Follow-Up: Your physician recommends that you schedule a follow-up appointment in: 1 MONTH   If you need a refill on your cardiac medications before your next appointment, please call your pharmacy.

## 2016-06-20 DIAGNOSIS — Z7901 Long term (current) use of anticoagulants: Secondary | ICD-10-CM | POA: Diagnosis not present

## 2016-06-21 DIAGNOSIS — R319 Hematuria, unspecified: Secondary | ICD-10-CM | POA: Diagnosis not present

## 2016-06-26 ENCOUNTER — Telehealth (HOSPITAL_COMMUNITY): Payer: Self-pay

## 2016-06-26 NOTE — Telephone Encounter (Signed)
Encounter complete. 

## 2016-06-28 ENCOUNTER — Ambulatory Visit (HOSPITAL_COMMUNITY)
Admission: RE | Admit: 2016-06-28 | Discharge: 2016-06-28 | Disposition: A | Discharge status: Home / Self Care | Payer: PPO | Source: Ambulatory Visit | Attending: Cardiology | Admitting: Cardiology

## 2016-06-28 DIAGNOSIS — R600 Localized edema: Secondary | ICD-10-CM

## 2016-06-28 DIAGNOSIS — R0602 Shortness of breath: Secondary | ICD-10-CM | POA: Diagnosis not present

## 2016-06-28 LAB — MYOCARDIAL PERFUSION IMAGING
CHL CUP NUCLEAR SDS: 2
CHL CUP NUCLEAR SRS: 0
CSEPPHR: 81 {beats}/min
LV dias vol: 127 mL (ref 62–150)
LV sys vol: 52 mL
Rest HR: 64 {beats}/min
SSS: 2
TID: 1.29

## 2016-06-28 MED ORDER — REGADENOSON 0.4 MG/5ML IV SOLN
0.4000 mg | Freq: Once | INTRAVENOUS | Status: AC
  Administered 2016-06-28: 0.4 mg via INTRAVENOUS

## 2016-06-28 MED ORDER — TECHNETIUM TC 99M TETROFOSMIN IV KIT
31.7000 | PACK | Freq: Once | INTRAVENOUS | Status: AC | PRN
  Administered 2016-06-28: 31.7 via INTRAVENOUS
  Filled 2016-06-28: qty 32

## 2016-06-28 MED ORDER — AMINOPHYLLINE 25 MG/ML IV SOLN
75.0000 mg | Freq: Once | INTRAVENOUS | Status: AC
  Administered 2016-06-28: 75 mg via INTRAVENOUS

## 2016-06-28 MED ORDER — TECHNETIUM TC 99M TETROFOSMIN IV KIT
10.2000 | PACK | Freq: Once | INTRAVENOUS | Status: AC | PRN
  Administered 2016-06-28: 10.2 via INTRAVENOUS
  Filled 2016-06-28: qty 11

## 2016-07-02 ENCOUNTER — Other Ambulatory Visit: Payer: Self-pay

## 2016-07-02 ENCOUNTER — Ambulatory Visit (HOSPITAL_COMMUNITY): Payer: PPO | Attending: Cardiology

## 2016-07-02 ENCOUNTER — Telehealth: Payer: Self-pay | Admitting: Cardiovascular Disease

## 2016-07-02 DIAGNOSIS — I341 Nonrheumatic mitral (valve) prolapse: Secondary | ICD-10-CM | POA: Diagnosis not present

## 2016-07-02 DIAGNOSIS — I501 Left ventricular failure: Secondary | ICD-10-CM | POA: Insufficient documentation

## 2016-07-02 DIAGNOSIS — R600 Localized edema: Secondary | ICD-10-CM

## 2016-07-02 DIAGNOSIS — R0602 Shortness of breath: Secondary | ICD-10-CM | POA: Diagnosis not present

## 2016-07-02 DIAGNOSIS — I77810 Thoracic aortic ectasia: Secondary | ICD-10-CM | POA: Diagnosis not present

## 2016-07-02 NOTE — Telephone Encounter (Signed)
Fu Message ° °Pt states he is returning RN call. Please call back to discuss  °

## 2016-07-02 NOTE — Telephone Encounter (Signed)
-----   Message from Skeet Latch, MD sent at 06/28/2016  4:27 PM EDT ----- Low risk stress test.

## 2016-07-02 NOTE — Telephone Encounter (Signed)
Advised patient of results.  

## 2016-07-06 DIAGNOSIS — G4733 Obstructive sleep apnea (adult) (pediatric): Secondary | ICD-10-CM | POA: Diagnosis not present

## 2016-07-06 DIAGNOSIS — R0600 Dyspnea, unspecified: Secondary | ICD-10-CM | POA: Diagnosis not present

## 2016-07-06 DIAGNOSIS — R5383 Other fatigue: Secondary | ICD-10-CM | POA: Diagnosis not present

## 2016-07-12 DIAGNOSIS — Z7901 Long term (current) use of anticoagulants: Secondary | ICD-10-CM | POA: Diagnosis not present

## 2016-07-15 ENCOUNTER — Other Ambulatory Visit: Payer: Self-pay | Admitting: *Deleted

## 2016-07-23 ENCOUNTER — Ambulatory Visit: Payer: PPO | Admitting: Cardiovascular Disease

## 2016-07-23 ENCOUNTER — Ambulatory Visit (INDEPENDENT_AMBULATORY_CARE_PROVIDER_SITE_OTHER): Payer: PPO | Admitting: Cardiovascular Disease

## 2016-07-23 ENCOUNTER — Encounter: Payer: Self-pay | Admitting: Cardiovascular Disease

## 2016-07-23 VITALS — BP 110/70 | HR 82 | Ht 70.0 in | Wt 266.8 lb

## 2016-07-23 DIAGNOSIS — R0602 Shortness of breath: Secondary | ICD-10-CM

## 2016-07-23 DIAGNOSIS — E6609 Other obesity due to excess calories: Secondary | ICD-10-CM

## 2016-07-23 NOTE — Progress Notes (Signed)
Cardiology Office Note   Date:  07/23/2016   ID:  BARNETT PRIDEAUX, DOB 07-28-1946, MRN [id number]  PCP:  Ann Held, MD  Cardiologist:   Skeet Latch, MD   Chief Complaint  Patient presents with  . Follow-up    test results    History of Present Illness: Patrick Osborn is a 70 y.o. male with GERD and prior PE who presents for follow up.  Mr. Mowad was seen in the ED 04/30/16 with leg pain and shortness of breath.  He had lower extremity Dopplers that were negative for DVT.  He followed up with his PCP, Dr. Ann Held, on 05/24/16 and was referred to cardiology for evaluation.  A BNP was checked at that appointment and was 43.7.  He reports swelling that has been ongoing for the last three years.  The symptoms began after he was admitted to [facility] in 2015 for a pulmonary embolism.  He doesn't recall being told that he had a DVT at that time.  He was critically ill and spent over 30 days in the hospital.  He has remained on warfarin since that time.  He reports that that hospitalization was complicated by an IVC filter that was implanted and later replaced due to migration.  He also had a retroperitoneal hematoma.    After his last appointment Mr. Yousif had an echo that revealed LVEF 60-65% with grade 1 diastolic dysfunction. There is also mild prolapse of the posterior leaflet of the mitral valve with mild mitral regurgitation. He was also noted to have a mildly dilated aortic root. He had a Lexiscan Myoview 06/28/16 that was negative for ischemia.  He notes that the lower extremity edema has improved. He continues to have shortness of breath and does not exercise much due to the symptoms. He has worked on inducing his salt intake but has not otherwise been any changes to his diet. His main complaint today is back pain. He denies any chest pain, orthopnea, or PND.  Past Medical History:  Diagnosis Date  . GERD (gastroesophageal reflux disease)   . Obesity   . PE (pulmonary  thromboembolism) ([place])   . Thyroid disease     No past surgical history on file.   Current Outpatient Prescriptions  Medication Sig Dispense Refill  . furosemide (LASIX) 20 MG tablet Take 20 mg by mouth daily.    Marland Kitchen levothyroxine (SYNTHROID, LEVOTHROID) 25 MCG tablet Take 25 mcg by mouth daily before breakfast.    . pantoprazole (PROTONIX) 40 MG tablet Take 40 mg by mouth daily.    Marland Kitchen warfarin (COUMADIN) 1 MG tablet Take 7-7.5 mg by mouth See admin instructions. Take 7 mg by mouth daily alternating with 7.5 mg by mouth every other day. Take with 6mg  to equal correct dose.    . warfarin (COUMADIN) 6 MG tablet Take 7-7.5 mg by mouth See admin instructions. Take 7 mg by mouth daily alternating with 7.5 mg by mouth every other day. Take with 1mg  to equal correct dose.     No current facility-administered medications for this visit.     Allergies:   Patient has no known allergies.    Social History:  The patient  reports that he has never smoked. He has never used smokeless tobacco. He reports that he does not drink alcohol or use drugs.   Family History:  The patient's family history includes Diabetes in his sister; Stroke in his father and mother.    ROS:  Please see  the history of present illness.   Otherwise, review of systems are positive for none.   All other systems are reviewed and negative.    PHYSICAL EXAM: VS:  BP 110/70 (BP Location: Right Arm, Patient Position: Sitting, Cuff Size: Large)   Pulse 82   Ht 5\' 10"  (1.778 m)   Wt 121 kg (266 lb 12.8 oz)   SpO2 96%   BMI 38.28 kg/m  , BMI Body mass index is 38.28 kg/m. GENERAL:  Well appearing HEENT:  Pupils equal round and reactive, fundi not visualized, oral mucosa unremarkable NECK:  No jugular venous distention, waveform within normal limits, carotid upstroke brisk and symmetric, no bruits LYMPHATICS:  No cervical adenopathy LUNGS:  Clear to auscultation bilaterally HEART:  RRR.  PMI not displaced or sustained,S1 and S2  within normal limits, no S3, no S4, no clicks, no rubs, no murmurs ABD:  Flat, positive bowel sounds normal in frequency in pitch, no bruits, no rebound, no guarding, no midline pulsatile mass, no hepatomegaly, no splenomegaly EXT:  2 plus pulses throughout, 2+ pitting edema on the R to above the knee.  1+ pitting edema on the left to the upper tibia, no cyanosis no clubbing SKIN:  No rashes no nodules NEURO:  Cranial nerves II through XII grossly intact, motor grossly intact throughout PSYCH:  Cognitively intact, oriented to person place and time   EKG:  EKG is ordered today. The ekg ordered today demonstrates sinus rhythm rate 68 bpm.   Lexiscan Myoview 06/28/16:  The left ventricular ejection fraction is normal (55-65%).  Nuclear stress EF: 59%.  There was no ST segment deviation noted during stress.  The study is normal.  This is a low risk study.   Low risk stress nuclear study with normal perfusion and normal left ventricular regional and global systolic function.  Echo 07/02/16: Study Conclusions  - Left ventricle: The cavity size was normal. Wall thickness was   increased in a pattern of mild LVH. Systolic function was normal.   The estimated ejection fraction was in the range of 60% to 65%.   Wall motion was normal; there were no regional wall motion   abnormalities. Doppler parameters are consistent with abnormal   left ventricular relaxation (grade 1 diastolic dysfunction). - Aortic root: The aortic root was mildly dilated. - Ascending aorta: The ascending aorta was mildly dilated. - Mitral valve: Prolapse, involving the posterior leaflet. There   was mild regurgitation. - Left atrium: The atrium was mildly dilated.  Recent Labs: 04/30/2016: ALT 22; B Natriuretic Peptide 36.5; BUN 10; Creatinine, Ser 1.03; Hemoglobin 14.8; Platelets 285; Potassium 4.1; Sodium 138   05/24/16: Sodium 140, potassium 4.2, BUN 10, creatinine 1.0 TSH 0.84, free T4 0.95 BNP  43.7  Lipid Panel No results found for: CHOL, TRIG, HDL, CHOLHDL, VLDL, LDLCALC, LDLDIRECT    Wt Readings from Last 3 Encounters:  07/23/16 121 kg (266 lb 12.8 oz)  06/28/16 120.7 kg (266 lb)  06/18/16 120.8 kg (266 lb 6.4 oz)      ASSESSMENT AND PLAN:  # Lower extremity edema: This is likely due to venous insufficiency from his prior pulmonary embolism. Symptoms have improved.  Echo revealed grade 1 diastolic dysfunction but no evidence of heart failure. BNP has been within normal limits when checked.  # Shortness of breath:   # Obesity: Mr. Burgeson has persistent shortness of breath.  His echo and stress test were essentially unremarkable. Suspect that his symptoms are due to obesity and central adiposity.  We discussed the importance of regular exercise and losing weight. I asked him to set a goal of losing 12 pounds by histology appointment in 3 months.  Current medicines are reviewed at length with the patient today.  The patient does not have concerns regarding medicines.  The following changes have been made:  no change  Labs/ tests ordered today include:   No orders of the defined types were placed in this encounter.   Time spent: 25 minutes-Greater than 50% of this time was spent in counseling, explanation of diagnosis, planning of further management, and coordination of care.  Disposition:   FU with Diogo Anne C. Oval Linsey, MD, [facility] in 3 months   This note was written with the assistance of speech recognition software.  Please excuse any transcriptional errors.  Signed, Camp Gopal C. Oval Linsey, MD, [facility]  07/23/2016 2:47 PM    [place]

## 2016-07-23 NOTE — Patient Instructions (Addendum)
Please increase her exercise to at least 15 or 20 minutes 2 days a week. Gradually increase this so that you are exercising up to 30 minutes most days of the week. Work on portion control and food choices with the goal of losing 12 pounds by your follow-up appointment in 3 months.  Your physician recommends that you continue on your current medications as directed. Please refer to the Current Medication list given to you today.  Your physician recommends that you schedule a follow-up appointment in: 3 month ov

## 2016-08-06 DIAGNOSIS — R5383 Other fatigue: Secondary | ICD-10-CM | POA: Diagnosis not present

## 2016-08-06 DIAGNOSIS — G4733 Obstructive sleep apnea (adult) (pediatric): Secondary | ICD-10-CM | POA: Diagnosis not present

## 2016-08-06 DIAGNOSIS — R0600 Dyspnea, unspecified: Secondary | ICD-10-CM | POA: Diagnosis not present

## 2016-09-05 DIAGNOSIS — R5383 Other fatigue: Secondary | ICD-10-CM | POA: Diagnosis not present

## 2016-09-05 DIAGNOSIS — G4733 Obstructive sleep apnea (adult) (pediatric): Secondary | ICD-10-CM | POA: Diagnosis not present

## 2016-09-05 DIAGNOSIS — R0600 Dyspnea, unspecified: Secondary | ICD-10-CM | POA: Diagnosis not present

## 2016-10-06 DIAGNOSIS — G4733 Obstructive sleep apnea (adult) (pediatric): Secondary | ICD-10-CM | POA: Diagnosis not present

## 2016-10-06 DIAGNOSIS — R5383 Other fatigue: Secondary | ICD-10-CM | POA: Diagnosis not present

## 2016-10-06 DIAGNOSIS — R0600 Dyspnea, unspecified: Secondary | ICD-10-CM | POA: Diagnosis not present

## 2016-10-14 DIAGNOSIS — Z7901 Long term (current) use of anticoagulants: Secondary | ICD-10-CM | POA: Diagnosis not present

## 2016-10-28 DIAGNOSIS — M545 Low back pain: Secondary | ICD-10-CM | POA: Diagnosis not present

## 2016-10-28 DIAGNOSIS — M6281 Muscle weakness (generalized): Secondary | ICD-10-CM | POA: Diagnosis not present

## 2016-10-30 DIAGNOSIS — M6281 Muscle weakness (generalized): Secondary | ICD-10-CM | POA: Diagnosis not present

## 2016-10-30 DIAGNOSIS — M545 Low back pain: Secondary | ICD-10-CM | POA: Diagnosis not present

## 2016-11-05 DIAGNOSIS — M545 Low back pain: Secondary | ICD-10-CM | POA: Diagnosis not present

## 2016-11-05 DIAGNOSIS — M6281 Muscle weakness (generalized): Secondary | ICD-10-CM | POA: Diagnosis not present

## 2016-11-06 ENCOUNTER — Ambulatory Visit: Payer: PPO | Admitting: Cardiovascular Disease

## 2016-11-06 DIAGNOSIS — R5383 Other fatigue: Secondary | ICD-10-CM | POA: Diagnosis not present

## 2016-11-06 DIAGNOSIS — R0600 Dyspnea, unspecified: Secondary | ICD-10-CM | POA: Diagnosis not present

## 2016-11-06 DIAGNOSIS — G4733 Obstructive sleep apnea (adult) (pediatric): Secondary | ICD-10-CM | POA: Diagnosis not present

## 2016-11-07 DIAGNOSIS — M6281 Muscle weakness (generalized): Secondary | ICD-10-CM | POA: Diagnosis not present

## 2016-11-07 DIAGNOSIS — M545 Low back pain: Secondary | ICD-10-CM | POA: Diagnosis not present

## 2016-11-18 DIAGNOSIS — Z7901 Long term (current) use of anticoagulants: Secondary | ICD-10-CM | POA: Diagnosis not present

## 2016-11-19 DIAGNOSIS — M545 Low back pain: Secondary | ICD-10-CM | POA: Diagnosis not present

## 2016-11-19 DIAGNOSIS — M6281 Muscle weakness (generalized): Secondary | ICD-10-CM | POA: Diagnosis not present

## 2016-11-21 DIAGNOSIS — M6281 Muscle weakness (generalized): Secondary | ICD-10-CM | POA: Diagnosis not present

## 2016-11-21 DIAGNOSIS — M545 Low back pain: Secondary | ICD-10-CM | POA: Diagnosis not present

## 2016-11-26 DIAGNOSIS — M545 Low back pain: Secondary | ICD-10-CM | POA: Diagnosis not present

## 2016-11-26 DIAGNOSIS — M6281 Muscle weakness (generalized): Secondary | ICD-10-CM | POA: Diagnosis not present

## 2016-11-29 DIAGNOSIS — M545 Low back pain: Secondary | ICD-10-CM | POA: Diagnosis not present

## 2016-11-29 DIAGNOSIS — M6281 Muscle weakness (generalized): Secondary | ICD-10-CM | POA: Diagnosis not present

## 2016-12-04 DIAGNOSIS — G4733 Obstructive sleep apnea (adult) (pediatric): Secondary | ICD-10-CM | POA: Diagnosis not present

## 2016-12-04 DIAGNOSIS — R0600 Dyspnea, unspecified: Secondary | ICD-10-CM | POA: Diagnosis not present

## 2016-12-04 DIAGNOSIS — R5383 Other fatigue: Secondary | ICD-10-CM | POA: Diagnosis not present

## 2016-12-04 DIAGNOSIS — M545 Low back pain: Secondary | ICD-10-CM | POA: Diagnosis not present

## 2016-12-04 DIAGNOSIS — M6281 Muscle weakness (generalized): Secondary | ICD-10-CM | POA: Diagnosis not present

## 2016-12-11 DIAGNOSIS — M545 Low back pain: Secondary | ICD-10-CM | POA: Diagnosis not present

## 2016-12-11 DIAGNOSIS — M6281 Muscle weakness (generalized): Secondary | ICD-10-CM | POA: Diagnosis not present

## 2016-12-23 DIAGNOSIS — Z7901 Long term (current) use of anticoagulants: Secondary | ICD-10-CM | POA: Diagnosis not present

## 2017-01-08 DIAGNOSIS — Z79899 Other long term (current) drug therapy: Secondary | ICD-10-CM | POA: Diagnosis not present

## 2017-01-08 DIAGNOSIS — Z125 Encounter for screening for malignant neoplasm of prostate: Secondary | ICD-10-CM | POA: Diagnosis not present

## 2017-01-08 DIAGNOSIS — Z1389 Encounter for screening for other disorder: Secondary | ICD-10-CM | POA: Diagnosis not present

## 2017-01-08 DIAGNOSIS — Z0000 Encounter for general adult medical examination without abnormal findings: Secondary | ICD-10-CM | POA: Diagnosis not present

## 2017-01-08 DIAGNOSIS — B372 Candidiasis of skin and nail: Secondary | ICD-10-CM | POA: Diagnosis not present

## 2017-01-08 DIAGNOSIS — L299 Pruritus, unspecified: Secondary | ICD-10-CM | POA: Diagnosis not present

## 2017-01-08 DIAGNOSIS — Z6841 Body Mass Index (BMI) 40.0 and over, adult: Secondary | ICD-10-CM | POA: Diagnosis not present

## 2017-01-08 DIAGNOSIS — M4306 Spondylolysis, lumbar region: Secondary | ICD-10-CM | POA: Diagnosis not present

## 2017-01-08 DIAGNOSIS — Z9181 History of falling: Secondary | ICD-10-CM | POA: Diagnosis not present

## 2017-01-08 DIAGNOSIS — E782 Mixed hyperlipidemia: Secondary | ICD-10-CM | POA: Diagnosis not present

## 2017-02-25 DIAGNOSIS — Z7901 Long term (current) use of anticoagulants: Secondary | ICD-10-CM | POA: Diagnosis not present

## 2017-03-03 DIAGNOSIS — Z7901 Long term (current) use of anticoagulants: Secondary | ICD-10-CM | POA: Diagnosis not present

## 2017-03-10 DIAGNOSIS — Z7901 Long term (current) use of anticoagulants: Secondary | ICD-10-CM | POA: Diagnosis not present

## 2017-03-27 DIAGNOSIS — M25551 Pain in right hip: Secondary | ICD-10-CM | POA: Diagnosis not present

## 2017-03-27 DIAGNOSIS — Z6841 Body Mass Index (BMI) 40.0 and over, adult: Secondary | ICD-10-CM | POA: Diagnosis not present

## 2017-03-27 DIAGNOSIS — Z7901 Long term (current) use of anticoagulants: Secondary | ICD-10-CM | POA: Diagnosis not present

## 2017-04-09 DIAGNOSIS — M25551 Pain in right hip: Secondary | ICD-10-CM | POA: Diagnosis not present

## 2017-04-14 DIAGNOSIS — C61 Malignant neoplasm of prostate: Secondary | ICD-10-CM | POA: Diagnosis not present

## 2017-05-08 DIAGNOSIS — Z7901 Long term (current) use of anticoagulants: Secondary | ICD-10-CM | POA: Diagnosis not present

## 2017-05-13 DIAGNOSIS — M7061 Trochanteric bursitis, right hip: Secondary | ICD-10-CM | POA: Diagnosis not present

## 2017-05-21 DIAGNOSIS — M25551 Pain in right hip: Secondary | ICD-10-CM | POA: Diagnosis not present

## 2017-05-21 DIAGNOSIS — M7061 Trochanteric bursitis, right hip: Secondary | ICD-10-CM | POA: Diagnosis not present

## 2017-06-04 DIAGNOSIS — M25551 Pain in right hip: Secondary | ICD-10-CM | POA: Diagnosis not present

## 2017-06-04 DIAGNOSIS — M7061 Trochanteric bursitis, right hip: Secondary | ICD-10-CM | POA: Diagnosis not present

## 2017-06-09 DIAGNOSIS — Z7901 Long term (current) use of anticoagulants: Secondary | ICD-10-CM | POA: Diagnosis not present

## 2017-07-22 DIAGNOSIS — Z7901 Long term (current) use of anticoagulants: Secondary | ICD-10-CM | POA: Diagnosis not present

## 2017-07-31 DIAGNOSIS — Z6837 Body mass index (BMI) 37.0-37.9, adult: Secondary | ICD-10-CM | POA: Diagnosis not present

## 2017-07-31 DIAGNOSIS — M4306 Spondylolysis, lumbar region: Secondary | ICD-10-CM | POA: Diagnosis not present

## 2017-07-31 DIAGNOSIS — M7631 Iliotibial band syndrome, right leg: Secondary | ICD-10-CM | POA: Diagnosis not present

## 2017-08-04 DIAGNOSIS — M25551 Pain in right hip: Secondary | ICD-10-CM | POA: Diagnosis not present

## 2017-08-04 DIAGNOSIS — M62551 Muscle wasting and atrophy, not elsewhere classified, right thigh: Secondary | ICD-10-CM | POA: Diagnosis not present

## 2017-08-04 DIAGNOSIS — R2689 Other abnormalities of gait and mobility: Secondary | ICD-10-CM | POA: Diagnosis not present

## 2017-08-06 DIAGNOSIS — R2689 Other abnormalities of gait and mobility: Secondary | ICD-10-CM | POA: Diagnosis not present

## 2017-08-06 DIAGNOSIS — M62551 Muscle wasting and atrophy, not elsewhere classified, right thigh: Secondary | ICD-10-CM | POA: Diagnosis not present

## 2017-08-06 DIAGNOSIS — M25551 Pain in right hip: Secondary | ICD-10-CM | POA: Diagnosis not present

## 2017-08-11 DIAGNOSIS — M25551 Pain in right hip: Secondary | ICD-10-CM | POA: Diagnosis not present

## 2017-08-11 DIAGNOSIS — M62551 Muscle wasting and atrophy, not elsewhere classified, right thigh: Secondary | ICD-10-CM | POA: Diagnosis not present

## 2017-08-11 DIAGNOSIS — R2689 Other abnormalities of gait and mobility: Secondary | ICD-10-CM | POA: Diagnosis not present

## 2017-08-15 DIAGNOSIS — M62551 Muscle wasting and atrophy, not elsewhere classified, right thigh: Secondary | ICD-10-CM | POA: Diagnosis not present

## 2017-08-15 DIAGNOSIS — R2689 Other abnormalities of gait and mobility: Secondary | ICD-10-CM | POA: Diagnosis not present

## 2017-08-15 DIAGNOSIS — M25551 Pain in right hip: Secondary | ICD-10-CM | POA: Diagnosis not present

## 2017-08-18 DIAGNOSIS — M62551 Muscle wasting and atrophy, not elsewhere classified, right thigh: Secondary | ICD-10-CM | POA: Diagnosis not present

## 2017-08-18 DIAGNOSIS — R2689 Other abnormalities of gait and mobility: Secondary | ICD-10-CM | POA: Diagnosis not present

## 2017-08-18 DIAGNOSIS — M25551 Pain in right hip: Secondary | ICD-10-CM | POA: Diagnosis not present

## 2017-08-20 DIAGNOSIS — M25551 Pain in right hip: Secondary | ICD-10-CM | POA: Diagnosis not present

## 2017-08-20 DIAGNOSIS — R2689 Other abnormalities of gait and mobility: Secondary | ICD-10-CM | POA: Diagnosis not present

## 2017-08-20 DIAGNOSIS — M62551 Muscle wasting and atrophy, not elsewhere classified, right thigh: Secondary | ICD-10-CM | POA: Diagnosis not present

## 2017-08-22 DIAGNOSIS — M62551 Muscle wasting and atrophy, not elsewhere classified, right thigh: Secondary | ICD-10-CM | POA: Diagnosis not present

## 2017-08-22 DIAGNOSIS — R2689 Other abnormalities of gait and mobility: Secondary | ICD-10-CM | POA: Diagnosis not present

## 2017-08-22 DIAGNOSIS — M25551 Pain in right hip: Secondary | ICD-10-CM | POA: Diagnosis not present

## 2017-08-26 DIAGNOSIS — M62551 Muscle wasting and atrophy, not elsewhere classified, right thigh: Secondary | ICD-10-CM | POA: Diagnosis not present

## 2017-08-26 DIAGNOSIS — R2689 Other abnormalities of gait and mobility: Secondary | ICD-10-CM | POA: Diagnosis not present

## 2017-08-26 DIAGNOSIS — M25551 Pain in right hip: Secondary | ICD-10-CM | POA: Diagnosis not present

## 2017-08-28 DIAGNOSIS — M62551 Muscle wasting and atrophy, not elsewhere classified, right thigh: Secondary | ICD-10-CM | POA: Diagnosis not present

## 2017-08-28 DIAGNOSIS — M25551 Pain in right hip: Secondary | ICD-10-CM | POA: Diagnosis not present

## 2017-08-28 DIAGNOSIS — R2689 Other abnormalities of gait and mobility: Secondary | ICD-10-CM | POA: Diagnosis not present

## 2017-08-29 DIAGNOSIS — M62551 Muscle wasting and atrophy, not elsewhere classified, right thigh: Secondary | ICD-10-CM | POA: Diagnosis not present

## 2017-08-29 DIAGNOSIS — R2689 Other abnormalities of gait and mobility: Secondary | ICD-10-CM | POA: Diagnosis not present

## 2017-08-29 DIAGNOSIS — M25551 Pain in right hip: Secondary | ICD-10-CM | POA: Diagnosis not present

## 2017-09-01 DIAGNOSIS — R2689 Other abnormalities of gait and mobility: Secondary | ICD-10-CM | POA: Diagnosis not present

## 2017-09-01 DIAGNOSIS — M25551 Pain in right hip: Secondary | ICD-10-CM | POA: Diagnosis not present

## 2017-09-01 DIAGNOSIS — Z7901 Long term (current) use of anticoagulants: Secondary | ICD-10-CM | POA: Diagnosis not present

## 2017-09-01 DIAGNOSIS — M62551 Muscle wasting and atrophy, not elsewhere classified, right thigh: Secondary | ICD-10-CM | POA: Diagnosis not present

## 2017-09-03 DIAGNOSIS — M62551 Muscle wasting and atrophy, not elsewhere classified, right thigh: Secondary | ICD-10-CM | POA: Diagnosis not present

## 2017-09-03 DIAGNOSIS — M25551 Pain in right hip: Secondary | ICD-10-CM | POA: Diagnosis not present

## 2017-09-03 DIAGNOSIS — R2689 Other abnormalities of gait and mobility: Secondary | ICD-10-CM | POA: Diagnosis not present

## 2017-09-10 DIAGNOSIS — R2689 Other abnormalities of gait and mobility: Secondary | ICD-10-CM | POA: Diagnosis not present

## 2017-09-10 DIAGNOSIS — M25551 Pain in right hip: Secondary | ICD-10-CM | POA: Diagnosis not present

## 2017-09-10 DIAGNOSIS — M62551 Muscle wasting and atrophy, not elsewhere classified, right thigh: Secondary | ICD-10-CM | POA: Diagnosis not present

## 2017-10-16 DIAGNOSIS — R1310 Dysphagia, unspecified: Secondary | ICD-10-CM | POA: Diagnosis not present

## 2017-10-16 DIAGNOSIS — G8929 Other chronic pain: Secondary | ICD-10-CM | POA: Diagnosis not present

## 2017-10-16 DIAGNOSIS — Z7901 Long term (current) use of anticoagulants: Secondary | ICD-10-CM | POA: Diagnosis not present

## 2017-10-16 DIAGNOSIS — M25551 Pain in right hip: Secondary | ICD-10-CM | POA: Diagnosis not present

## 2017-10-16 DIAGNOSIS — Z6837 Body mass index (BMI) 37.0-37.9, adult: Secondary | ICD-10-CM | POA: Diagnosis not present

## 2017-10-29 DIAGNOSIS — K219 Gastro-esophageal reflux disease without esophagitis: Secondary | ICD-10-CM | POA: Diagnosis not present

## 2017-10-29 DIAGNOSIS — R1310 Dysphagia, unspecified: Secondary | ICD-10-CM | POA: Diagnosis not present

## 2017-11-10 DIAGNOSIS — Z7901 Long term (current) use of anticoagulants: Secondary | ICD-10-CM | POA: Diagnosis not present

## 2017-11-11 DIAGNOSIS — Z7901 Long term (current) use of anticoagulants: Secondary | ICD-10-CM | POA: Diagnosis not present

## 2017-11-11 DIAGNOSIS — Z8546 Personal history of malignant neoplasm of prostate: Secondary | ICD-10-CM | POA: Diagnosis not present

## 2017-11-11 DIAGNOSIS — R634 Abnormal weight loss: Secondary | ICD-10-CM | POA: Diagnosis not present

## 2017-11-11 DIAGNOSIS — R1310 Dysphagia, unspecified: Secondary | ICD-10-CM | POA: Diagnosis not present

## 2017-11-11 DIAGNOSIS — K317 Polyp of stomach and duodenum: Secondary | ICD-10-CM | POA: Diagnosis not present

## 2017-11-11 DIAGNOSIS — K2950 Unspecified chronic gastritis without bleeding: Secondary | ICD-10-CM | POA: Diagnosis not present

## 2017-11-11 DIAGNOSIS — D126 Benign neoplasm of colon, unspecified: Secondary | ICD-10-CM | POA: Diagnosis not present

## 2017-11-11 DIAGNOSIS — Z86718 Personal history of other venous thrombosis and embolism: Secondary | ICD-10-CM | POA: Diagnosis not present

## 2017-11-11 DIAGNOSIS — Z8673 Personal history of transient ischemic attack (TIA), and cerebral infarction without residual deficits: Secondary | ICD-10-CM | POA: Diagnosis not present

## 2017-11-11 DIAGNOSIS — K449 Diaphragmatic hernia without obstruction or gangrene: Secondary | ICD-10-CM | POA: Diagnosis not present

## 2017-11-11 DIAGNOSIS — Z86711 Personal history of pulmonary embolism: Secondary | ICD-10-CM | POA: Diagnosis not present

## 2017-11-11 DIAGNOSIS — K222 Esophageal obstruction: Secondary | ICD-10-CM | POA: Diagnosis not present

## 2017-11-11 DIAGNOSIS — Z79899 Other long term (current) drug therapy: Secondary | ICD-10-CM | POA: Diagnosis not present

## 2017-11-11 DIAGNOSIS — E079 Disorder of thyroid, unspecified: Secondary | ICD-10-CM | POA: Diagnosis not present

## 2017-11-17 DIAGNOSIS — Z7901 Long term (current) use of anticoagulants: Secondary | ICD-10-CM | POA: Diagnosis not present

## 2017-12-01 DIAGNOSIS — M47816 Spondylosis without myelopathy or radiculopathy, lumbar region: Secondary | ICD-10-CM | POA: Diagnosis not present

## 2017-12-01 DIAGNOSIS — R1084 Generalized abdominal pain: Secondary | ICD-10-CM | POA: Diagnosis not present

## 2017-12-01 DIAGNOSIS — Z6838 Body mass index (BMI) 38.0-38.9, adult: Secondary | ICD-10-CM | POA: Diagnosis not present

## 2017-12-08 DIAGNOSIS — R1084 Generalized abdominal pain: Secondary | ICD-10-CM | POA: Diagnosis not present

## 2017-12-08 DIAGNOSIS — K5730 Diverticulosis of large intestine without perforation or abscess without bleeding: Secondary | ICD-10-CM | POA: Diagnosis not present

## 2017-12-10 DIAGNOSIS — M549 Dorsalgia, unspecified: Secondary | ICD-10-CM | POA: Diagnosis not present

## 2017-12-10 DIAGNOSIS — R109 Unspecified abdominal pain: Secondary | ICD-10-CM | POA: Diagnosis not present

## 2017-12-11 ENCOUNTER — Other Ambulatory Visit: Payer: Self-pay | Admitting: Family Medicine

## 2017-12-11 DIAGNOSIS — M47816 Spondylosis without myelopathy or radiculopathy, lumbar region: Secondary | ICD-10-CM

## 2017-12-13 ENCOUNTER — Ambulatory Visit
Admission: RE | Admit: 2017-12-13 | Discharge: 2017-12-13 | Disposition: A | Discharge status: Home / Self Care | Payer: PPO | Source: Ambulatory Visit | Attending: Family Medicine | Admitting: Family Medicine

## 2017-12-13 DIAGNOSIS — M47816 Spondylosis without myelopathy or radiculopathy, lumbar region: Secondary | ICD-10-CM

## 2017-12-13 DIAGNOSIS — M48061 Spinal stenosis, lumbar region without neurogenic claudication: Secondary | ICD-10-CM | POA: Diagnosis not present

## 2017-12-14 DIAGNOSIS — I5032 Chronic diastolic (congestive) heart failure: Secondary | ICD-10-CM | POA: Diagnosis not present

## 2017-12-14 DIAGNOSIS — E039 Hypothyroidism, unspecified: Secondary | ICD-10-CM | POA: Diagnosis not present

## 2017-12-14 DIAGNOSIS — I6789 Other cerebrovascular disease: Secondary | ICD-10-CM | POA: Diagnosis not present

## 2017-12-14 DIAGNOSIS — J9811 Atelectasis: Secondary | ICD-10-CM | POA: Diagnosis not present

## 2017-12-14 DIAGNOSIS — G92 Toxic encephalopathy: Secondary | ICD-10-CM | POA: Diagnosis not present

## 2017-12-14 DIAGNOSIS — T424X5A Adverse effect of benzodiazepines, initial encounter: Secondary | ICD-10-CM | POA: Diagnosis not present

## 2017-12-14 DIAGNOSIS — R4781 Slurred speech: Secondary | ICD-10-CM | POA: Diagnosis not present

## 2017-12-14 DIAGNOSIS — B020 Zoster encephalitis: Secondary | ICD-10-CM | POA: Diagnosis not present

## 2017-12-14 DIAGNOSIS — R4182 Altered mental status, unspecified: Secondary | ICD-10-CM | POA: Diagnosis not present

## 2017-12-15 DIAGNOSIS — G629 Polyneuropathy, unspecified: Secondary | ICD-10-CM | POA: Diagnosis not present

## 2017-12-15 DIAGNOSIS — D72828 Other elevated white blood cell count: Secondary | ICD-10-CM | POA: Diagnosis not present

## 2017-12-15 DIAGNOSIS — I110 Hypertensive heart disease with heart failure: Secondary | ICD-10-CM | POA: Diagnosis not present

## 2017-12-15 DIAGNOSIS — G8929 Other chronic pain: Secondary | ICD-10-CM | POA: Diagnosis not present

## 2017-12-15 DIAGNOSIS — Z9181 History of falling: Secondary | ICD-10-CM | POA: Diagnosis not present

## 2017-12-15 DIAGNOSIS — E039 Hypothyroidism, unspecified: Secondary | ICD-10-CM | POA: Diagnosis not present

## 2017-12-15 DIAGNOSIS — G92 Toxic encephalopathy: Secondary | ICD-10-CM | POA: Diagnosis not present

## 2017-12-15 DIAGNOSIS — G9340 Encephalopathy, unspecified: Secondary | ICD-10-CM | POA: Diagnosis not present

## 2017-12-15 DIAGNOSIS — K219 Gastro-esophageal reflux disease without esophagitis: Secondary | ICD-10-CM | POA: Diagnosis not present

## 2017-12-15 DIAGNOSIS — R4182 Altered mental status, unspecified: Secondary | ICD-10-CM | POA: Diagnosis not present

## 2017-12-15 DIAGNOSIS — F0390 Unspecified dementia without behavioral disturbance: Secondary | ICD-10-CM | POA: Diagnosis not present

## 2017-12-15 DIAGNOSIS — J9811 Atelectasis: Secondary | ICD-10-CM | POA: Diagnosis not present

## 2017-12-15 DIAGNOSIS — E860 Dehydration: Secondary | ICD-10-CM | POA: Diagnosis not present

## 2017-12-15 DIAGNOSIS — R140 Abdominal distension (gaseous): Secondary | ICD-10-CM | POA: Diagnosis not present

## 2017-12-15 DIAGNOSIS — R5381 Other malaise: Secondary | ICD-10-CM | POA: Diagnosis not present

## 2017-12-15 DIAGNOSIS — Z86711 Personal history of pulmonary embolism: Secondary | ICD-10-CM | POA: Diagnosis not present

## 2017-12-15 DIAGNOSIS — B029 Zoster without complications: Secondary | ICD-10-CM | POA: Diagnosis not present

## 2017-12-15 DIAGNOSIS — B020 Zoster encephalitis: Secondary | ICD-10-CM | POA: Diagnosis not present

## 2017-12-15 DIAGNOSIS — M549 Dorsalgia, unspecified: Secondary | ICD-10-CM | POA: Diagnosis not present

## 2017-12-15 DIAGNOSIS — Z7901 Long term (current) use of anticoagulants: Secondary | ICD-10-CM | POA: Diagnosis not present

## 2017-12-15 DIAGNOSIS — I5032 Chronic diastolic (congestive) heart failure: Secondary | ICD-10-CM | POA: Diagnosis not present

## 2017-12-15 DIAGNOSIS — T424X5A Adverse effect of benzodiazepines, initial encounter: Secondary | ICD-10-CM | POA: Diagnosis not present

## 2017-12-20 ENCOUNTER — Other Ambulatory Visit: Payer: PPO

## 2017-12-22 DIAGNOSIS — Z7901 Long term (current) use of anticoagulants: Secondary | ICD-10-CM | POA: Diagnosis not present

## 2017-12-22 DIAGNOSIS — M47816 Spondylosis without myelopathy or radiculopathy, lumbar region: Secondary | ICD-10-CM | POA: Diagnosis not present

## 2017-12-22 DIAGNOSIS — Z6839 Body mass index (BMI) 39.0-39.9, adult: Secondary | ICD-10-CM | POA: Diagnosis not present

## 2017-12-22 DIAGNOSIS — G8929 Other chronic pain: Secondary | ICD-10-CM | POA: Diagnosis not present

## 2017-12-23 ENCOUNTER — Other Ambulatory Visit: Payer: Self-pay

## 2017-12-23 DIAGNOSIS — Z86711 Personal history of pulmonary embolism: Secondary | ICD-10-CM | POA: Diagnosis not present

## 2017-12-23 DIAGNOSIS — E039 Hypothyroidism, unspecified: Secondary | ICD-10-CM | POA: Diagnosis not present

## 2017-12-23 DIAGNOSIS — M545 Low back pain: Secondary | ICD-10-CM | POA: Diagnosis not present

## 2017-12-23 DIAGNOSIS — G8929 Other chronic pain: Secondary | ICD-10-CM | POA: Diagnosis not present

## 2017-12-23 DIAGNOSIS — Z79899 Other long term (current) drug therapy: Secondary | ICD-10-CM | POA: Diagnosis not present

## 2017-12-23 DIAGNOSIS — R1031 Right lower quadrant pain: Secondary | ICD-10-CM | POA: Diagnosis not present

## 2017-12-23 DIAGNOSIS — Z8673 Personal history of transient ischemic attack (TIA), and cerebral infarction without residual deficits: Secondary | ICD-10-CM | POA: Diagnosis not present

## 2017-12-23 DIAGNOSIS — Z7901 Long term (current) use of anticoagulants: Secondary | ICD-10-CM | POA: Diagnosis not present

## 2017-12-23 NOTE — Patient Outreach (Signed)
[facility]) Care Management  12/23/2017  GIOVANIE LEFEBRE 12-27-1945 [id number]     Transition of Care Referral  Referral Date: 12/23/17 Referral Source: HTA Discharge Report Date of Admission: unknown Diagnosis: unknown Date of Discharge: 12/19/17 Facility: [place]: HTA    Referral received. No outreach warranted at this time. TOC will be completed by primary care provider office who will refer to [facility] care mgmt if needed.   Plan: RN CM will close case at this time.    Enzo Montgomery, RN,BSN,CCM [place] Management Telephonic Care Management Coordinator Direct Phone: [phone] Toll Free: [phone] Fax: [phone]

## 2017-12-24 DIAGNOSIS — E039 Hypothyroidism, unspecified: Secondary | ICD-10-CM | POA: Diagnosis not present

## 2017-12-24 DIAGNOSIS — Z5181 Encounter for therapeutic drug level monitoring: Secondary | ICD-10-CM | POA: Diagnosis not present

## 2017-12-24 DIAGNOSIS — M47816 Spondylosis without myelopathy or radiculopathy, lumbar region: Secondary | ICD-10-CM | POA: Diagnosis not present

## 2017-12-24 DIAGNOSIS — I5032 Chronic diastolic (congestive) heart failure: Secondary | ICD-10-CM | POA: Diagnosis not present

## 2017-12-24 DIAGNOSIS — L89151 Pressure ulcer of sacral region, stage 1: Secondary | ICD-10-CM | POA: Diagnosis not present

## 2017-12-24 DIAGNOSIS — M25551 Pain in right hip: Secondary | ICD-10-CM | POA: Diagnosis not present

## 2017-12-24 DIAGNOSIS — G609 Hereditary and idiopathic neuropathy, unspecified: Secondary | ICD-10-CM | POA: Diagnosis not present

## 2017-12-24 DIAGNOSIS — G8929 Other chronic pain: Secondary | ICD-10-CM | POA: Diagnosis not present

## 2017-12-24 DIAGNOSIS — Z7901 Long term (current) use of anticoagulants: Secondary | ICD-10-CM | POA: Diagnosis not present

## 2017-12-24 DIAGNOSIS — Z86711 Personal history of pulmonary embolism: Secondary | ICD-10-CM | POA: Diagnosis not present

## 2017-12-24 DIAGNOSIS — M1991 Primary osteoarthritis, unspecified site: Secondary | ICD-10-CM | POA: Diagnosis not present

## 2017-12-25 DIAGNOSIS — Z7901 Long term (current) use of anticoagulants: Secondary | ICD-10-CM | POA: Diagnosis not present

## 2017-12-26 ENCOUNTER — Encounter: Payer: Self-pay | Admitting: Neurology

## 2017-12-26 ENCOUNTER — Ambulatory Visit (INDEPENDENT_AMBULATORY_CARE_PROVIDER_SITE_OTHER): Payer: PPO | Admitting: Neurology

## 2017-12-26 VITALS — BP 140/78 | HR 78 | Ht 69.0 in | Wt 240.0 lb

## 2017-12-26 DIAGNOSIS — G20 Parkinson's disease: Secondary | ICD-10-CM | POA: Diagnosis not present

## 2017-12-26 DIAGNOSIS — M25551 Pain in right hip: Secondary | ICD-10-CM | POA: Diagnosis not present

## 2017-12-26 DIAGNOSIS — M1611 Unilateral primary osteoarthritis, right hip: Secondary | ICD-10-CM | POA: Diagnosis not present

## 2017-12-26 DIAGNOSIS — M7061 Trochanteric bursitis, right hip: Secondary | ICD-10-CM | POA: Diagnosis not present

## 2017-12-26 MED ORDER — CARBIDOPA-LEVODOPA 25-100 MG PO TABS: 1.0000 | ORAL_TABLET | Freq: Three times a day (TID) | ORAL | 5 refills | Status: DC

## 2017-12-26 NOTE — Progress Notes (Signed)
[facility] Clinic Note - Initial Visit   Date: 12/26/17  IKER NUTTALL MRN: [id number] DOB: April 29, 1946   Dear Dr. Nicki Reaper:   Thank you for your kind referral of Patrick Osborn for consultation of right hip pain. Although his history is well known to you, please allow [place] to reiterate it for the purpose of our medical record. The patient was accompanied to the clinic by daughter who also provides collateral information.     History of Present Illness: Patrick Osborn is a 72 y.o. Caucasian male with history of right cerebellar stroke and PE (2014), GERD, and hypothyroidism presenting for evaluation of right hip pain and low back pain.    Starting in November 2018, he starting having low back pain which was not preceded by any injury.  Pain is localized over the right buttocks, hip, and involves his lateral thigh.  It is unrelenting, described as sharp and throbbing.  Pain is worse with raising the left up and walking. There is no numbness, tingling, burning, or electrical quality to his pain. He denies any bowel/bladder incontinence, cramps, or muscle spasms. He denies any weakness of the legs, but has noticed gait instability when upright and has been using a walker over the past week.  He has been seen by. Dr. Adin Hector, orthopeadics, who did a cortisone injection of the right hip for bursitis, but there was no improvement.  He was had MRI lumbar spine on 3/31 and was premedicated with ativan, but developed severe cognitive changes and became incoherent.  He was hospitalized for acute mental status change at [facility] 3/31/201 - 12/19/2017 and had CSF testing to evaluation for encephalitis, which was normal.  Medication side effect was suspected and he was discharged home on seroquel.  His wife has stopped seroquel because she felt it was making more more agitated at night.  Patient is tearful, because he has no relief of pain.  He has very poor quality of life.  MRI  lumbar spine did not show any nerve impingement to explain right sided symptoms, there is foraminal stenosis on the left at L5-S1, which is asymptomatic.    Out-side paper records, electronic medical record, and images have been reviewed where available and summarized as:  MRI lumbar spine wo contrast 12/14/2017: 1. Bulky right far-lateral endplate spurs at K9-3 and L2-3. There is superimposed edematous signal at L1-2 and bridging at L2-3. 2. Generalized mild to moderate disc degeneration. Disc height loss and bulge causes moderate left foraminal narrowing at L5-S1. 3. Degenerative facet arthropathy mainly at L4-5 and L5-S1. 4. Sacroiliac ankylosis bilaterally, likely degenerative given associated spurring.  Past Medical History:  Diagnosis Date  . GERD (gastroesophageal reflux disease)   . Obesity   . PE (pulmonary thromboembolism) ([place])   . Thyroid disease     History reviewed. No pertinent surgical history.   Medications:  Outpatient Encounter Medications as of 12/26/2017  Medication Sig Note  . alprazolam (XANAX) 2 MG tablet TK 1 T PO 1 TO 2 H PRIOR TO PROCEDURE   . Enoxaparin Sodium (LOVENOX [place]) Inject into the skin.   . furosemide (LASIX) 20 MG tablet Take by mouth.   . gabapentin (NEURONTIN) 300 MG capsule    . levothyroxine (SYNTHROID, LEVOTHROID) 25 MCG tablet Take by mouth.   . pantoprazole (PROTONIX) 40 MG tablet Take 40 mg by mouth daily.   . QUEtiapine (SEROQUEL) 25 MG tablet    . tiZANidine (ZANAFLEX) 4 MG tablet TK 1 T  PO IN THE EVE FOR BACK PAIN   . warfarin (COUMADIN) 1 MG tablet Take 7-7.5 mg by mouth See admin instructions. Take 7 mg by mouth daily alternating with 7.5 mg by mouth every other day. Take with 6mg  to equal correct dose. 04/30/2016: Pt took 7mg  08/15  . warfarin (COUMADIN) 6 MG tablet Take 7-7.5 mg by mouth See admin instructions. Take 7 mg by mouth daily alternating with 7.5 mg by mouth every other day. Take with 1mg  to equal correct dose. 04/30/2016: Pt  took 7mg  08/15  . carbidopa-levodopa (SINEMET IR) 25-100 MG tablet Take 1 tablet by mouth 3 (three) times daily.   . [DISCONTINUED] furosemide (LASIX) 20 MG tablet Take 20 mg by mouth daily.   . [DISCONTINUED] levothyroxine (SYNTHROID, LEVOTHROID) 25 MCG tablet Take 25 mcg by mouth daily before breakfast.    No facility-administered encounter medications on file as of 12/26/2017.      Allergies: No Known Allergies  Family History: Family History  Problem Relation Age of Onset  . Stroke Mother   . Stroke Father   . Diabetes Sister     Social History: Social History   Tobacco Use  . Smoking status: Never Smoker  . Smokeless tobacco: Never Used  Substance Use Topics  . Alcohol use: No  . Drug use: No   Social History   Social History Narrative   Lives with wife in a one story home.  Has a ramp to get in and out of the house.  Has 2 daughters.  Retired.  Education: high school.     Review of Systems:  CONSTITUTIONAL: No fevers, chills, night sweats, or weight loss.   EYES: No visual changes or eye pain ENT: No hearing changes.  No history of nose bleeds.   RESPIRATORY: No cough, wheezing and shortness of breath.   CARDIOVASCULAR: Negative for chest pain, and palpitations.   GI: Negative for abdominal discomfort, blood in stools or black stools.  No recent change in bowel habits.   GU:  No history of incontinence.   MUSCLOSKELETAL: ++history of joint pain or swelling.  +myalgias.   SKIN: Negative for lesions, rash, and itching.   HEMATOLOGY/ONCOLOGY: Negative for prolonged bleeding, bruising easily, and swollen nodes.  No history of cancer.   ENDOCRINE: Negative for cold or heat intolerance, polydipsia or goiter.   PSYCH:  +depression or anxiety symptoms.   NEURO: As Above.   Vital Signs:  BP 140/78   Pulse 78   Ht 5\' 9"  (1.753 m)   Wt 240 lb (108.9 kg)   SpO2 96%   BMI 35.44 kg/m    General Medical Exam:   General:  Tired appearing, tearful at time, in distress  due pain, sitting in wheelchair   Eyes/ENT: see cranial nerve examination.   Neck: No masses appreciated.  Full range of motion without tenderness.  No carotid bruits. Respiratory:  Clear to auscultation, good air entry bilaterally.   Cardiac:  Regular rate and rhythm, no murmur.   Extremities:  Pain is reproduced with palpation over the right hip region.   Skin:  Left thigh with erythematous healing vesicle  Neurological Exam: MENTAL STATUS:  He is oriented to person, place, and year.  He correctly identifies his address, phone number, and president.  He states the month is November and day of the week is Saturday.  Speech is soft, not dysarthric.  Severely blunted affect.    CRANIAL NERVES: II:  No visual field defects.  Unremarkable fundi.  III-IV-VI: Pupils equal round and reactive to light.  Normal conjugate, extra-ocular eye movements in all directions of gaze.  No nystagmus.  No ptosis.   V:  Normal facial sensation.  Jaw jerk is absent.   VII:  Normal facial symmetry and movements.  Snout is present, Myerson's is present.   VIII:  Normal hearing and vestibular function.   IX-X:  Normal palatal movement.   XI:  Normal shoulder shrug and head rotation.   XII:  Normal tongue strength and range of motion, no deviation or fasciculation.  MOTOR:  Resting hand tremor is present on the right.  No pronator drift.  There is rigidity in both arms and legs.  Right Upper Extremity:    Left Upper Extremity:    Deltoid  5/5   Deltoid  5/5   Biceps  5/5   Biceps  5/5   Triceps  5/5   Triceps  5/5   Wrist extensors  5/5   Wrist extensors  5/5   Wrist flexors  5/5   Wrist flexors  5/5   Finger extensors  5/5   Finger extensors  5/5   Finger flexors  5/5   Finger flexors  5/5   Dorsal interossei  5/5   Dorsal interossei  5/5   Abductor pollicis  5/5   Abductor pollicis  5/5    Right Lower Extremity:    Left Lower Extremity:    Hip flexors  5/5   Hip flexors  5/5   Hip extensors  5/5   Hip  extensors  5/5   Knee flexors  5/5   Knee flexors  5/5   Knee extensors  5/5   Knee extensors  5/5   Dorsiflexors  5/5   Dorsiflexors  5/5   Plantarflexors  5/5   Plantarflexors  5/5   Toe extensors  5/5   Toe extensors  5/5   Toe flexors  5/5   Toe flexors  5/5    MSRs:  Right                                                                 Left brachioradialis 3+  brachioradialis 3+  biceps 3+  biceps 3+  triceps 3+  triceps 3+  patellar 3+  patellar 3+  ankle jerk 2+  ankle jerk 2+  Hoffman no  Hoffman no   SENSORY:  Normal and symmetric perception of light touch, pinprick, vibration.   COORDINATION/GAIT: Normal finger-to- nose-finger.  Severely slowed and reduced amplitude of finger tapping bilaterally.  Patient is able to stand, appears unsteady. Gait not tested as his pain is too severe   IMPRESSION: 1.  Right hip pain - severe and debilitating.  I do not see a neurological basis for his pain.  MRI lumbar spine does not show nerve impingement which would correspond to his level, therefore, have urged him to follow-up with orthopeadics to further evaluate this. Bursitis has been mentioned in the past.  Daughter is very worried and unable to take him home with the severity of his pain, so will go to the ER for further work-up and pain management.  He has tenderness with palpation over the right hip region.  He has not had a MRI of the right hip and  this may be warranted to look for structural pathology, and will defer this to his PCP and orthopaedic surgery to determine.  Recommend stopping gabapentin and taking tizanidine 4mg  at bedtime.     2.  Parkinson's disease.  Exam shows blunted affect, severe bradykinesia, right hand resting tremor, and rigidity.  I will start him on sinemet 25/100 half tablet and titrate to 1 tablet three times daily.   3.  Generalized hyperreflexia could be related to his Parkinson's disease and h/o cerebellar stroke.  Consider MRI cervical spine going  forward.  Priority at this time is addressing his right hip pain.  Return to clinic in 4 months.  The duration of this appointment visit was 60 minutes of face-to-face time with the patient.  Greater than 50% of this time was spent in counseling, explanation of diagnosis, planning of further management, and coordination of care.   Thank you for allowing me to participate in patient's care.  If I can answer any additional questions, I would be pleased to do so.    Sincerely,    Nylene Inlow K. Posey Pronto, DO

## 2017-12-26 NOTE — Patient Instructions (Addendum)
Start Carbidopa Levodopa as follows at least 30-min prior to meals:     AM  Afternoon   Evening   Week 1:  1/2 tab  1/2 tab   1/2 tab  Week 2:   1/2 tab  1/2 tab   1 tab  Week 3:  1/2 tab  1 tab   1 tab  Week 4:  1 tab  1 tab   1 tab  *Avoid taking with protein products, such as milk, meat, cheese  *if you develop nausea, take with crackers  Stop gabapentin  Start tizanidine at bedtime  Follow-up with your orthopaedic doctor for your right hip pain  Return to clinic in 4 months

## 2017-12-27 DIAGNOSIS — M533 Sacrococcygeal disorders, not elsewhere classified: Secondary | ICD-10-CM | POA: Diagnosis not present

## 2017-12-27 DIAGNOSIS — M25451 Effusion, right hip: Secondary | ICD-10-CM | POA: Diagnosis not present

## 2017-12-27 DIAGNOSIS — M160 Bilateral primary osteoarthritis of hip: Secondary | ICD-10-CM | POA: Diagnosis not present

## 2017-12-27 DIAGNOSIS — R630 Anorexia: Secondary | ICD-10-CM | POA: Diagnosis not present

## 2017-12-27 DIAGNOSIS — M247 Protrusio acetabuli: Secondary | ICD-10-CM | POA: Diagnosis not present

## 2017-12-27 DIAGNOSIS — Z888 Allergy status to other drugs, medicaments and biological substances status: Secondary | ICD-10-CM | POA: Diagnosis not present

## 2017-12-27 DIAGNOSIS — M25551 Pain in right hip: Secondary | ICD-10-CM | POA: Diagnosis not present

## 2017-12-27 DIAGNOSIS — Z885 Allergy status to narcotic agent status: Secondary | ICD-10-CM | POA: Diagnosis not present

## 2017-12-29 DIAGNOSIS — Z7901 Long term (current) use of anticoagulants: Secondary | ICD-10-CM | POA: Diagnosis not present

## 2017-12-29 DIAGNOSIS — M25551 Pain in right hip: Secondary | ICD-10-CM | POA: Diagnosis not present

## 2017-12-29 DIAGNOSIS — M1611 Unilateral primary osteoarthritis, right hip: Secondary | ICD-10-CM | POA: Diagnosis not present

## 2018-01-10 DIAGNOSIS — R1013 Epigastric pain: Secondary | ICD-10-CM | POA: Diagnosis not present

## 2018-01-10 DIAGNOSIS — R109 Unspecified abdominal pain: Secondary | ICD-10-CM | POA: Diagnosis not present

## 2018-01-14 DIAGNOSIS — Z5181 Encounter for therapeutic drug level monitoring: Secondary | ICD-10-CM | POA: Diagnosis not present

## 2018-01-14 DIAGNOSIS — M1611 Unilateral primary osteoarthritis, right hip: Secondary | ICD-10-CM | POA: Diagnosis not present

## 2018-01-14 DIAGNOSIS — M25551 Pain in right hip: Secondary | ICD-10-CM | POA: Diagnosis not present

## 2018-01-19 DIAGNOSIS — Z7901 Long term (current) use of anticoagulants: Secondary | ICD-10-CM | POA: Diagnosis not present

## 2018-01-20 DIAGNOSIS — R0602 Shortness of breath: Secondary | ICD-10-CM | POA: Diagnosis not present

## 2018-01-20 DIAGNOSIS — I480 Paroxysmal atrial fibrillation: Secondary | ICD-10-CM | POA: Diagnosis not present

## 2018-01-20 DIAGNOSIS — R531 Weakness: Secondary | ICD-10-CM | POA: Diagnosis not present

## 2018-01-20 DIAGNOSIS — R7989 Other specified abnormal findings of blood chemistry: Secondary | ICD-10-CM | POA: Diagnosis not present

## 2018-01-20 DIAGNOSIS — R109 Unspecified abdominal pain: Secondary | ICD-10-CM | POA: Diagnosis not present

## 2018-01-20 DIAGNOSIS — E039 Hypothyroidism, unspecified: Secondary | ICD-10-CM | POA: Diagnosis not present

## 2018-01-20 DIAGNOSIS — I493 Ventricular premature depolarization: Secondary | ICD-10-CM | POA: Diagnosis not present

## 2018-01-20 DIAGNOSIS — G20 Parkinson's disease: Secondary | ICD-10-CM | POA: Diagnosis not present

## 2018-01-20 DIAGNOSIS — R404 Transient alteration of awareness: Secondary | ICD-10-CM | POA: Diagnosis not present

## 2018-01-20 DIAGNOSIS — I700 Atherosclerosis of aorta: Secondary | ICD-10-CM | POA: Diagnosis not present

## 2018-01-21 DIAGNOSIS — Z79899 Other long term (current) drug therapy: Secondary | ICD-10-CM | POA: Diagnosis not present

## 2018-01-21 DIAGNOSIS — E039 Hypothyroidism, unspecified: Secondary | ICD-10-CM | POA: Diagnosis not present

## 2018-01-21 DIAGNOSIS — R7989 Other specified abnormal findings of blood chemistry: Secondary | ICD-10-CM | POA: Diagnosis not present

## 2018-01-21 DIAGNOSIS — D72823 Leukemoid reaction: Secondary | ICD-10-CM | POA: Diagnosis not present

## 2018-01-21 DIAGNOSIS — Z888 Allergy status to other drugs, medicaments and biological substances status: Secondary | ICD-10-CM | POA: Diagnosis not present

## 2018-01-21 DIAGNOSIS — R0602 Shortness of breath: Secondary | ICD-10-CM | POA: Diagnosis not present

## 2018-01-21 DIAGNOSIS — M549 Dorsalgia, unspecified: Secondary | ICD-10-CM | POA: Diagnosis not present

## 2018-01-21 DIAGNOSIS — M25551 Pain in right hip: Secondary | ICD-10-CM | POA: Diagnosis not present

## 2018-01-21 DIAGNOSIS — Z885 Allergy status to narcotic agent status: Secondary | ICD-10-CM | POA: Diagnosis not present

## 2018-01-21 DIAGNOSIS — Z8673 Personal history of transient ischemic attack (TIA), and cerebral infarction without residual deficits: Secondary | ICD-10-CM | POA: Diagnosis not present

## 2018-01-21 DIAGNOSIS — R2689 Other abnormalities of gait and mobility: Secondary | ICD-10-CM | POA: Diagnosis not present

## 2018-01-21 DIAGNOSIS — I493 Ventricular premature depolarization: Secondary | ICD-10-CM | POA: Diagnosis not present

## 2018-01-21 DIAGNOSIS — R531 Weakness: Secondary | ICD-10-CM | POA: Diagnosis not present

## 2018-01-21 DIAGNOSIS — Z8546 Personal history of malignant neoplasm of prostate: Secondary | ICD-10-CM | POA: Diagnosis not present

## 2018-01-21 DIAGNOSIS — G20 Parkinson's disease: Secondary | ICD-10-CM | POA: Diagnosis not present

## 2018-01-21 DIAGNOSIS — G8929 Other chronic pain: Secondary | ICD-10-CM | POA: Diagnosis not present

## 2018-01-21 DIAGNOSIS — F329 Major depressive disorder, single episode, unspecified: Secondary | ICD-10-CM | POA: Diagnosis not present

## 2018-01-21 DIAGNOSIS — I700 Atherosclerosis of aorta: Secondary | ICD-10-CM | POA: Diagnosis not present

## 2018-01-21 DIAGNOSIS — Z86711 Personal history of pulmonary embolism: Secondary | ICD-10-CM | POA: Diagnosis not present

## 2018-01-21 DIAGNOSIS — K219 Gastro-esophageal reflux disease without esophagitis: Secondary | ICD-10-CM | POA: Diagnosis not present

## 2018-01-21 DIAGNOSIS — I4891 Unspecified atrial fibrillation: Secondary | ICD-10-CM | POA: Diagnosis not present

## 2018-01-21 DIAGNOSIS — Z7901 Long term (current) use of anticoagulants: Secondary | ICD-10-CM | POA: Diagnosis not present

## 2018-01-26 DIAGNOSIS — I480 Paroxysmal atrial fibrillation: Secondary | ICD-10-CM | POA: Diagnosis not present

## 2018-01-26 DIAGNOSIS — G20 Parkinson's disease: Secondary | ICD-10-CM | POA: Diagnosis not present

## 2018-01-26 DIAGNOSIS — Z6836 Body mass index (BMI) 36.0-36.9, adult: Secondary | ICD-10-CM | POA: Diagnosis not present

## 2018-01-26 DIAGNOSIS — R1310 Dysphagia, unspecified: Secondary | ICD-10-CM | POA: Diagnosis not present

## 2018-02-02 DIAGNOSIS — R1031 Right lower quadrant pain: Secondary | ICD-10-CM | POA: Diagnosis not present

## 2018-02-02 DIAGNOSIS — R109 Unspecified abdominal pain: Secondary | ICD-10-CM | POA: Diagnosis not present

## 2018-02-02 DIAGNOSIS — R1032 Left lower quadrant pain: Secondary | ICD-10-CM | POA: Diagnosis not present

## 2018-02-03 DIAGNOSIS — R109 Unspecified abdominal pain: Secondary | ICD-10-CM | POA: Diagnosis not present

## 2018-02-03 DIAGNOSIS — G20 Parkinson's disease: Secondary | ICD-10-CM | POA: Diagnosis not present

## 2018-02-03 DIAGNOSIS — G8929 Other chronic pain: Secondary | ICD-10-CM | POA: Diagnosis not present

## 2018-02-03 DIAGNOSIS — M549 Dorsalgia, unspecified: Secondary | ICD-10-CM | POA: Diagnosis not present

## 2018-02-04 DIAGNOSIS — D126 Benign neoplasm of colon, unspecified: Secondary | ICD-10-CM | POA: Diagnosis not present

## 2018-02-04 DIAGNOSIS — R1310 Dysphagia, unspecified: Secondary | ICD-10-CM | POA: Diagnosis not present

## 2018-02-04 DIAGNOSIS — R634 Abnormal weight loss: Secondary | ICD-10-CM | POA: Diagnosis not present

## 2018-02-06 DIAGNOSIS — M545 Low back pain: Secondary | ICD-10-CM | POA: Diagnosis not present

## 2018-02-11 ENCOUNTER — Other Ambulatory Visit: Payer: Self-pay | Admitting: *Deleted

## 2018-02-11 NOTE — Patient Outreach (Signed)
High Risk HTA Screening call attempted. I was not able to speak with Patrick Osborn but was able to leave a message and requested a return call.  Eulah Pont. Myrtie Neither, MSN, [facility] Gerontological Nurse Practitioner [facility] Care Management [phone]

## 2018-02-13 DIAGNOSIS — Z6835 Body mass index (BMI) 35.0-35.9, adult: Secondary | ICD-10-CM | POA: Diagnosis not present

## 2018-02-13 DIAGNOSIS — M5136 Other intervertebral disc degeneration, lumbar region: Secondary | ICD-10-CM | POA: Diagnosis not present

## 2018-02-13 DIAGNOSIS — M47816 Spondylosis without myelopathy or radiculopathy, lumbar region: Secondary | ICD-10-CM | POA: Diagnosis not present

## 2018-02-13 DIAGNOSIS — M545 Low back pain: Secondary | ICD-10-CM | POA: Diagnosis not present

## 2018-02-16 DIAGNOSIS — M47816 Spondylosis without myelopathy or radiculopathy, lumbar region: Secondary | ICD-10-CM | POA: Diagnosis not present

## 2018-02-20 ENCOUNTER — Encounter: Payer: Self-pay | Admitting: *Deleted

## 2018-02-20 NOTE — Patient Outreach (Signed)
Pt wife returned my call. I introduced [facility] services to her and she advised they have Mr. Ahn management well in hand. I advised that I would send her a letter for her future reference to access our services.  Eulah Pont. Myrtie Neither, MSN, [facility] Gerontological Nurse Practitioner [facility] Care Management [phone]

## 2018-03-17 DIAGNOSIS — M5136 Other intervertebral disc degeneration, lumbar region: Secondary | ICD-10-CM | POA: Diagnosis not present

## 2018-03-25 DIAGNOSIS — Z7901 Long term (current) use of anticoagulants: Secondary | ICD-10-CM | POA: Diagnosis not present

## 2018-03-31 DIAGNOSIS — M5136 Other intervertebral disc degeneration, lumbar region: Secondary | ICD-10-CM | POA: Diagnosis not present

## 2018-03-31 DIAGNOSIS — Z79899 Other long term (current) drug therapy: Secondary | ICD-10-CM | POA: Diagnosis not present

## 2018-03-31 DIAGNOSIS — M545 Low back pain: Secondary | ICD-10-CM | POA: Diagnosis not present

## 2018-04-20 DIAGNOSIS — Z7901 Long term (current) use of anticoagulants: Secondary | ICD-10-CM | POA: Diagnosis not present

## 2018-04-23 DIAGNOSIS — Z95828 Presence of other vascular implants and grafts: Secondary | ICD-10-CM | POA: Diagnosis not present

## 2018-04-23 DIAGNOSIS — R110 Nausea: Secondary | ICD-10-CM | POA: Diagnosis not present

## 2018-04-23 DIAGNOSIS — I482 Chronic atrial fibrillation: Secondary | ICD-10-CM | POA: Diagnosis not present

## 2018-04-23 DIAGNOSIS — E782 Mixed hyperlipidemia: Secondary | ICD-10-CM | POA: Diagnosis not present

## 2018-04-23 DIAGNOSIS — R5383 Other fatigue: Secondary | ICD-10-CM | POA: Diagnosis not present

## 2018-04-23 DIAGNOSIS — G20 Parkinson's disease: Secondary | ICD-10-CM | POA: Diagnosis not present

## 2018-04-23 DIAGNOSIS — I82509 Chronic embolism and thrombosis of unspecified deep veins of unspecified lower extremity: Secondary | ICD-10-CM | POA: Diagnosis not present

## 2018-04-23 DIAGNOSIS — C61 Malignant neoplasm of prostate: Secondary | ICD-10-CM | POA: Diagnosis not present

## 2018-04-23 DIAGNOSIS — I2699 Other pulmonary embolism without acute cor pulmonale: Secondary | ICD-10-CM | POA: Diagnosis not present

## 2018-04-23 DIAGNOSIS — R600 Localized edema: Secondary | ICD-10-CM | POA: Diagnosis not present

## 2018-04-23 DIAGNOSIS — Z79899 Other long term (current) drug therapy: Secondary | ICD-10-CM | POA: Diagnosis not present

## 2018-04-23 DIAGNOSIS — Z7901 Long term (current) use of anticoagulants: Secondary | ICD-10-CM | POA: Diagnosis not present

## 2018-04-23 DIAGNOSIS — K219 Gastro-esophageal reflux disease without esophagitis: Secondary | ICD-10-CM | POA: Diagnosis not present

## 2018-04-23 DIAGNOSIS — E039 Hypothyroidism, unspecified: Secondary | ICD-10-CM | POA: Diagnosis not present

## 2018-04-23 DIAGNOSIS — R5381 Other malaise: Secondary | ICD-10-CM | POA: Diagnosis not present

## 2018-05-07 DIAGNOSIS — C441121 Basal cell carcinoma of skin of right upper eyelid, including canthus: Secondary | ICD-10-CM | POA: Diagnosis not present

## 2018-05-07 DIAGNOSIS — L578 Other skin changes due to chronic exposure to nonionizing radiation: Secondary | ICD-10-CM | POA: Diagnosis not present

## 2018-05-07 DIAGNOSIS — L821 Other seborrheic keratosis: Secondary | ICD-10-CM | POA: Diagnosis not present

## 2018-05-14 DIAGNOSIS — E039 Hypothyroidism, unspecified: Secondary | ICD-10-CM | POA: Diagnosis not present

## 2018-05-14 DIAGNOSIS — I4891 Unspecified atrial fibrillation: Secondary | ICD-10-CM | POA: Diagnosis not present

## 2018-05-26 DIAGNOSIS — R5381 Other malaise: Secondary | ICD-10-CM | POA: Diagnosis not present

## 2018-05-26 DIAGNOSIS — H00012 Hordeolum externum right lower eyelid: Secondary | ICD-10-CM | POA: Diagnosis not present

## 2018-05-26 DIAGNOSIS — I2699 Other pulmonary embolism without acute cor pulmonale: Secondary | ICD-10-CM | POA: Diagnosis not present

## 2018-05-26 DIAGNOSIS — R5383 Other fatigue: Secondary | ICD-10-CM | POA: Diagnosis not present

## 2018-05-26 DIAGNOSIS — G20 Parkinson's disease: Secondary | ICD-10-CM | POA: Diagnosis not present

## 2018-05-26 DIAGNOSIS — Z7901 Long term (current) use of anticoagulants: Secondary | ICD-10-CM | POA: Diagnosis not present

## 2018-05-26 DIAGNOSIS — E538 Deficiency of other specified B group vitamins: Secondary | ICD-10-CM | POA: Diagnosis not present

## 2018-05-26 DIAGNOSIS — C4490 Unspecified malignant neoplasm of skin, unspecified: Secondary | ICD-10-CM | POA: Diagnosis not present

## 2018-05-26 DIAGNOSIS — R269 Unspecified abnormalities of gait and mobility: Secondary | ICD-10-CM | POA: Diagnosis not present

## 2018-05-26 DIAGNOSIS — I5022 Chronic systolic (congestive) heart failure: Secondary | ICD-10-CM | POA: Diagnosis not present

## 2018-05-26 DIAGNOSIS — E039 Hypothyroidism, unspecified: Secondary | ICD-10-CM | POA: Diagnosis not present

## 2018-05-26 DIAGNOSIS — I482 Chronic atrial fibrillation: Secondary | ICD-10-CM | POA: Diagnosis not present

## 2018-06-08 ENCOUNTER — Encounter: Payer: Self-pay | Admitting: Neurology

## 2018-06-08 ENCOUNTER — Ambulatory Visit (INDEPENDENT_AMBULATORY_CARE_PROVIDER_SITE_OTHER): Payer: PPO | Admitting: Neurology

## 2018-06-08 VITALS — BP 100/70 | HR 72 | Ht 69.0 in | Wt 230.4 lb

## 2018-06-08 DIAGNOSIS — G20 Parkinson's disease: Secondary | ICD-10-CM | POA: Diagnosis not present

## 2018-06-08 MED ORDER — CARBIDOPA-LEVODOPA 25-100 MG PO TABS: 1.0000 | ORAL_TABLET | Freq: Three times a day (TID) | ORAL | 3 refills | Status: DC

## 2018-06-08 NOTE — Progress Notes (Signed)
Follow-up Visit   Date: 06/08/18    Patrick Osborn MRN: [id number] DOB: Jan 02, 1946   Interim History: Patrick Osborn is a 72 y.o. right cerebellar stroke and PE (2014), GERD, atrial fibrillation, and hypothyroidism returning to the clinic for follow-up of Parkinson's disease.  The patient was accompanied to the clinic by daughter who also provides collateral information.    History of present illness: Starting in November 2018, he starting having low back pain which was not preceded by any injury.  Pain is localized over the right buttocks, hip, and involves his lateral thigh.  It is unrelenting, described as sharp and throbbing.  Pain is worse with raising the left up and walking. There is no numbness, tingling, burning, or electrical quality to his pain. He denies any bowel/bladder incontinence, cramps, or muscle spasms. He denies any weakness of the legs, but has noticed gait instability when upright and has been using a walker over the past week.  He has been seen by. Dr. Adin Hector, orthopeadics, who did a cortisone injection of the right hip for bursitis, but there was no improvement.  He was had MRI lumbar spine on 3/31 and was premedicated with ativan, but developed severe cognitive changes and became incoherent.  He was hospitalized for acute mental status change at [facility] 3/31/201 - 12/19/2017 and had CSF testing to evaluation for encephalitis, which was normal.  Medication side effect was suspected and he was discharged home on seroquel.  His wife has stopped seroquel because she felt it was making more more agitated at night.  Patient is tearful, because he has no relief of pain.  He has very poor quality of life.  MRI lumbar spine did not show any nerve impingement to explain right sided symptoms, there is foraminal stenosis on the left at L5-S1, which is asymptomatic.   UPDATE 06/08/2018: He is here for six-month follow-up visit.  At his last visit, he was started on Sinemet 1  tablet 3 times a day due to clinical suspicion for Parkinson's disease.  He has been taking 1 to 2 tablets daily, but has not noticed a marked change.  His right hip pain was found to be secondary to degenerative changes of the spine and is followed by spine and scoliosis.  He has underwent a series of epidural steroid injections, with minimal benefit and was recommended surgery, which she is not keen on pursuing at this time.  He has not done physical therapy, and expresses interest.  Family also notices his soft speech and would like to do speech therapy.  He has not had any falls and walks unassisted.  Medications:  Current Outpatient Medications on File Prior to Visit  Medication Sig Dispense Refill  . carvedilol (COREG) 6.25 MG tablet TK 1 T PO BID  9  . gabapentin (NEURONTIN) 300 MG capsule 300 mg 3 (three) times daily.   4  . levothyroxine (SYNTHROID, LEVOTHROID) 25 MCG tablet Take by mouth.    . pantoprazole (PROTONIX) 40 MG tablet Take 40 mg by mouth daily.    Marland Kitchen warfarin (COUMADIN) 6 MG tablet Take 7-7.5 mg by mouth See admin instructions. Take 7 mg by mouth daily alternating with 7.5 mg by mouth every other day. Take with 1mg  to equal correct dose.     No current facility-administered medications on file prior to visit.     Allergies: No Known Allergies  Review of Systems:  CONSTITUTIONAL: No fevers, chills, night sweats, or weight loss.  EYES:  No visual changes or eye pain ENT: No hearing changes.  No history of nose bleeds.   RESPIRATORY: No cough, wheezing and shortness of breath.   CARDIOVASCULAR: Negative for chest pain, and palpitations.   GI: Negative for abdominal discomfort, blood in stools or black stools.  No recent change in bowel habits.   GU:  No history of incontinence.   MUSCLOSKELETAL: +history of joint pain or swelling.  No myalgias.   SKIN: Negative for lesions, rash, and itching.   ENDOCRINE: Negative for cold or heat intolerance, polydipsia or goiter.     PSYCH:  + depression or anxiety symptoms.   NEURO: As Above.   Vital Signs:  BP 100/70   Pulse 72   Ht 5\' 9"  (1.753 m)   Wt 230 lb 6 oz (104.5 kg)   SpO2 96%   BMI 34.02 kg/m   General Medical Exam:   General:  Well appearing, comfortable, severely blunted affect.  Eyes/ENT: see cranial nerve examination.   Neck:  No carotid bruits. Respiratory:  Clear to auscultation, good air entry bilaterally.   Cardiac:  Regular rate and rhythm, no murmur.   Ext:  No edema  Neurological Exam: MENTAL STATUS including orientation to time, place, person, recent and remote memory, attention span and concentration, language, and fund of knowledge is fair.  Speech is not dysarthric, hypophonic.  CRANIAL NERVES:  Pupils equal round and reactive to light.  Normal conjugate, extra-ocular eye movements in all directions of gaze.  Right eye with entropion.   Face is symmetric. Palate elevates symmetrically.  Tongue is midline.  MOTOR:  Motor strength is 5/5 in all extremities.  Resting tremor of the right hand is present.  There is increased tone of the right hand and leg.  Tone of the left side is normal.   SENSORY:  Intact to vibration throughout.  COORDINATION/GAIT:  Normal finger-to- nose-finger.  Bradykinesia with finger tapping and heel tapping on the right.  Gait narrow based, unassisted, and unsteady with turns.  Data: MRI lumbar spine wo contrast 12/14/2017: 1. Bulky right far-lateral endplate spurs at M5-7 and L2-3. There is superimposed edematous signal at L1-2 and bridging at L2-3. 2. Generalized mild to moderate disc degeneration. Disc height loss and bulge causes moderate left foraminal narrowing at L5-S1. 3. Degenerative facet arthropathy mainly at L4-5 and L5-S1. 4. Sacroiliac ankylosis bilaterally, likely degenerative given associated spurring.  IMPRESSION/PLAN: Idiopathic Parkinson's disease manifesting with bradykinesia, right hand tremor, and severely blunted affect.    -  Encourage compliance with Sinemet 25/101 tablet 3 times daily.  Refills were provided.  - Start speech therapy and physical therapy  - Fall precautions discussed, encouraged to use for support  Return to clinic in 6 months    Thank you for allowing me to participate in patient's care.  If I can answer any additional questions, I would be pleased to do so.    Sincerely,    Donika K. Posey Pronto, DO

## 2018-06-08 NOTE — Patient Instructions (Signed)
Referral to speech therapy and physical therapy  Continue sinemet 25/100 1 tablet three times daily  Return to clinic in 6 months

## 2018-06-09 DIAGNOSIS — Z7901 Long term (current) use of anticoagulants: Secondary | ICD-10-CM | POA: Diagnosis not present

## 2018-06-09 DIAGNOSIS — Z5181 Encounter for therapeutic drug level monitoring: Secondary | ICD-10-CM | POA: Diagnosis not present

## 2018-06-09 DIAGNOSIS — E538 Deficiency of other specified B group vitamins: Secondary | ICD-10-CM | POA: Diagnosis not present

## 2018-06-12 ENCOUNTER — Encounter: Payer: Self-pay | Admitting: *Deleted

## 2018-06-17 DIAGNOSIS — N3289 Other specified disorders of bladder: Secondary | ICD-10-CM | POA: Diagnosis not present

## 2018-06-17 DIAGNOSIS — C61 Malignant neoplasm of prostate: Secondary | ICD-10-CM | POA: Diagnosis not present

## 2018-06-23 DIAGNOSIS — G20 Parkinson's disease: Secondary | ICD-10-CM | POA: Diagnosis not present

## 2018-06-23 DIAGNOSIS — R2689 Other abnormalities of gait and mobility: Secondary | ICD-10-CM | POA: Diagnosis not present

## 2018-06-23 DIAGNOSIS — R2681 Unsteadiness on feet: Secondary | ICD-10-CM | POA: Diagnosis not present

## 2018-06-23 DIAGNOSIS — M6281 Muscle weakness (generalized): Secondary | ICD-10-CM | POA: Diagnosis not present

## 2018-07-29 DIAGNOSIS — E538 Deficiency of other specified B group vitamins: Secondary | ICD-10-CM | POA: Diagnosis not present

## 2018-07-29 DIAGNOSIS — Z7901 Long term (current) use of anticoagulants: Secondary | ICD-10-CM | POA: Diagnosis not present

## 2018-08-03 DIAGNOSIS — M6281 Muscle weakness (generalized): Secondary | ICD-10-CM | POA: Diagnosis not present

## 2018-08-03 DIAGNOSIS — R2689 Other abnormalities of gait and mobility: Secondary | ICD-10-CM | POA: Diagnosis not present

## 2018-08-03 DIAGNOSIS — G20 Parkinson's disease: Secondary | ICD-10-CM | POA: Diagnosis not present

## 2018-08-03 DIAGNOSIS — R2681 Unsteadiness on feet: Secondary | ICD-10-CM | POA: Diagnosis not present

## 2018-08-07 DIAGNOSIS — R5383 Other fatigue: Secondary | ICD-10-CM | POA: Diagnosis not present

## 2018-08-07 DIAGNOSIS — R5381 Other malaise: Secondary | ICD-10-CM | POA: Diagnosis not present

## 2018-08-07 DIAGNOSIS — K219 Gastro-esophageal reflux disease without esophagitis: Secondary | ICD-10-CM | POA: Diagnosis not present

## 2018-08-07 DIAGNOSIS — Z79899 Other long term (current) drug therapy: Secondary | ICD-10-CM | POA: Diagnosis not present

## 2018-08-07 DIAGNOSIS — I2699 Other pulmonary embolism without acute cor pulmonale: Secondary | ICD-10-CM | POA: Diagnosis not present

## 2018-08-07 DIAGNOSIS — M150 Primary generalized (osteo)arthritis: Secondary | ICD-10-CM | POA: Diagnosis not present

## 2018-08-07 DIAGNOSIS — I482 Chronic atrial fibrillation, unspecified: Secondary | ICD-10-CM | POA: Diagnosis not present

## 2018-08-07 DIAGNOSIS — R300 Dysuria: Secondary | ICD-10-CM | POA: Diagnosis not present

## 2018-08-07 DIAGNOSIS — R269 Unspecified abnormalities of gait and mobility: Secondary | ICD-10-CM | POA: Diagnosis not present

## 2018-08-07 DIAGNOSIS — G20 Parkinson's disease: Secondary | ICD-10-CM | POA: Diagnosis not present

## 2018-08-07 DIAGNOSIS — M791 Myalgia, unspecified site: Secondary | ICD-10-CM | POA: Diagnosis not present

## 2018-08-07 DIAGNOSIS — M5136 Other intervertebral disc degeneration, lumbar region: Secondary | ICD-10-CM | POA: Diagnosis not present

## 2018-08-07 DIAGNOSIS — M48061 Spinal stenosis, lumbar region without neurogenic claudication: Secondary | ICD-10-CM | POA: Diagnosis not present

## 2018-08-20 DIAGNOSIS — Z7901 Long term (current) use of anticoagulants: Secondary | ICD-10-CM | POA: Diagnosis not present

## 2018-08-24 DIAGNOSIS — Z7901 Long term (current) use of anticoagulants: Secondary | ICD-10-CM | POA: Diagnosis not present

## 2018-08-28 DIAGNOSIS — L259 Unspecified contact dermatitis, unspecified cause: Secondary | ICD-10-CM | POA: Diagnosis not present

## 2018-08-28 DIAGNOSIS — E538 Deficiency of other specified B group vitamins: Secondary | ICD-10-CM | POA: Diagnosis not present

## 2018-08-31 DIAGNOSIS — M461 Sacroiliitis, not elsewhere classified: Secondary | ICD-10-CM | POA: Diagnosis not present

## 2018-09-01 ENCOUNTER — Other Ambulatory Visit: Payer: Self-pay

## 2018-09-01 DIAGNOSIS — D72829 Elevated white blood cell count, unspecified: Secondary | ICD-10-CM | POA: Diagnosis not present

## 2018-09-01 DIAGNOSIS — R410 Disorientation, unspecified: Secondary | ICD-10-CM | POA: Diagnosis not present

## 2018-09-01 DIAGNOSIS — R079 Chest pain, unspecified: Secondary | ICD-10-CM | POA: Diagnosis not present

## 2018-09-01 DIAGNOSIS — R0602 Shortness of breath: Secondary | ICD-10-CM | POA: Diagnosis not present

## 2018-09-01 NOTE — Patient Outreach (Signed)
[facility]) Care Management  09/01/2018  Patrick Osborn October 09, 1945 [id number]   Telephone Screen  Referral Date: 08/22/18 Referral Source: Nurse Call Center Referral Reason: " 08/31/18-9:14pm-caller states husband had an injection in his hip today and now he is jittery,jumpy and confused, this has happened before, denies any other symptoms"- Spouse advised to call 911 Insurance: HTA   Outreach attempt # 1 to patient. No answer at present. RN CM left HIPAA compliant voicemail message along with contact info.    Plan: RN CM will make outreach attempt to patient within 3-4 business days. RN CM will send unsuccessful outreach letter to patient.    Enzo Montgomery, RN,BSN,CCM [place] Management Telephonic Care Management Coordinator Direct Phone: [phone] Toll Free: [phone] Fax: [phone]

## 2018-09-02 ENCOUNTER — Other Ambulatory Visit: Payer: Self-pay

## 2018-09-02 DIAGNOSIS — K219 Gastro-esophageal reflux disease without esophagitis: Secondary | ICD-10-CM | POA: Diagnosis not present

## 2018-09-02 DIAGNOSIS — I2699 Other pulmonary embolism without acute cor pulmonale: Secondary | ICD-10-CM | POA: Diagnosis not present

## 2018-09-02 DIAGNOSIS — Z7901 Long term (current) use of anticoagulants: Secondary | ICD-10-CM | POA: Diagnosis not present

## 2018-09-02 DIAGNOSIS — R5381 Other malaise: Secondary | ICD-10-CM | POA: Diagnosis not present

## 2018-09-02 DIAGNOSIS — Z515 Encounter for palliative care: Secondary | ICD-10-CM | POA: Diagnosis not present

## 2018-09-02 DIAGNOSIS — G20 Parkinson's disease: Secondary | ICD-10-CM | POA: Diagnosis not present

## 2018-09-02 DIAGNOSIS — M48061 Spinal stenosis, lumbar region without neurogenic claudication: Secondary | ICD-10-CM | POA: Diagnosis not present

## 2018-09-02 DIAGNOSIS — R410 Disorientation, unspecified: Secondary | ICD-10-CM | POA: Diagnosis not present

## 2018-09-02 DIAGNOSIS — E039 Hypothyroidism, unspecified: Secondary | ICD-10-CM | POA: Diagnosis not present

## 2018-09-02 DIAGNOSIS — M150 Primary generalized (osteo)arthritis: Secondary | ICD-10-CM | POA: Diagnosis not present

## 2018-09-02 DIAGNOSIS — I5022 Chronic systolic (congestive) heart failure: Secondary | ICD-10-CM | POA: Diagnosis not present

## 2018-09-02 DIAGNOSIS — I482 Chronic atrial fibrillation, unspecified: Secondary | ICD-10-CM | POA: Diagnosis not present

## 2018-09-02 NOTE — Patient Outreach (Signed)
[facility]) Care Management  09/02/2018  Patrick Osborn [birth year] [id number]   Telephone Screen  Referral Date: 08/22/18 Referral Source: Nurse Call Center Referral Reason: " 08/31/18-9:14 pm-caller states husband had an injection in his hip today and now he is jittery,jumpy and confused, this has happened before, denies any other symptoms"- Spouse advised to call 911 Insurance: HTA    Outreach attempt #2 to patient. Spoke with spouse(DPR on file). She voices that she handles patient's affairs. RN CM discussed recent call to nurse line. Spouse voices that patient is very sensitive to medications and has had multiple reactions/issues with meds in the past. She took nurse advice and took patient to ER. Patient no longer having symptoms except he remains confused related to Parkinson's disease. She reports that patient is going today for PCP follow up appt. She voices that patient will be seeing new PCP(Dr. Bea Graff) as previous PCP is retiring at the end of this month. Spouse states that MD is already aware of th e issue that happened a few days ago and will be further assessing patient during visit. Spouse denies any further issues or concerns at this time. She is aware that she can call 24 hr nurse line for any future needs or concerns and voiced understanding. She was appreciative of follow up call.      Plan: RN CM will close case at this time. RN CM will have PCP info updated.  Enzo Montgomery, RN,BSN,CCM [place] Management Telephonic Care Management Coordinator Direct Phone: [phone] Toll Free: [phone] Fax: [phone]

## 2018-09-07 DIAGNOSIS — Z7901 Long term (current) use of anticoagulants: Secondary | ICD-10-CM | POA: Diagnosis not present

## 2018-09-07 DIAGNOSIS — R079 Chest pain, unspecified: Secondary | ICD-10-CM | POA: Diagnosis not present

## 2018-09-07 DIAGNOSIS — Z5329 Procedure and treatment not carried out because of patient's decision for other reasons: Secondary | ICD-10-CM | POA: Diagnosis not present

## 2018-09-07 DIAGNOSIS — R0602 Shortness of breath: Secondary | ICD-10-CM | POA: Diagnosis not present

## 2018-09-08 DIAGNOSIS — B354 Tinea corporis: Secondary | ICD-10-CM | POA: Diagnosis not present

## 2018-09-08 DIAGNOSIS — G20 Parkinson's disease: Secondary | ICD-10-CM | POA: Diagnosis not present

## 2018-09-08 DIAGNOSIS — Z7901 Long term (current) use of anticoagulants: Secondary | ICD-10-CM | POA: Diagnosis not present

## 2018-09-08 DIAGNOSIS — R05 Cough: Secondary | ICD-10-CM | POA: Diagnosis not present

## 2018-09-08 DIAGNOSIS — I2699 Other pulmonary embolism without acute cor pulmonale: Secondary | ICD-10-CM | POA: Diagnosis not present

## 2018-09-08 DIAGNOSIS — I482 Chronic atrial fibrillation, unspecified: Secondary | ICD-10-CM | POA: Diagnosis not present

## 2018-09-08 DIAGNOSIS — R1310 Dysphagia, unspecified: Secondary | ICD-10-CM | POA: Diagnosis not present

## 2018-09-08 DIAGNOSIS — I5022 Chronic systolic (congestive) heart failure: Secondary | ICD-10-CM | POA: Diagnosis not present

## 2018-09-10 DIAGNOSIS — R410 Disorientation, unspecified: Secondary | ICD-10-CM | POA: Diagnosis not present

## 2018-09-11 ENCOUNTER — Telehealth: Payer: Self-pay | Admitting: Neurology

## 2018-09-11 DIAGNOSIS — J9811 Atelectasis: Secondary | ICD-10-CM | POA: Diagnosis not present

## 2018-09-11 DIAGNOSIS — Z7901 Long term (current) use of anticoagulants: Secondary | ICD-10-CM | POA: Diagnosis not present

## 2018-09-11 DIAGNOSIS — Z7952 Long term (current) use of systemic steroids: Secondary | ICD-10-CM | POA: Diagnosis not present

## 2018-09-11 DIAGNOSIS — Z741 Need for assistance with personal care: Secondary | ICD-10-CM | POA: Diagnosis not present

## 2018-09-11 DIAGNOSIS — R609 Edema, unspecified: Secondary | ICD-10-CM | POA: Diagnosis not present

## 2018-09-11 DIAGNOSIS — R269 Unspecified abnormalities of gait and mobility: Secondary | ICD-10-CM | POA: Diagnosis not present

## 2018-09-11 DIAGNOSIS — F329 Major depressive disorder, single episode, unspecified: Secondary | ICD-10-CM | POA: Diagnosis not present

## 2018-09-11 DIAGNOSIS — R410 Disorientation, unspecified: Secondary | ICD-10-CM | POA: Diagnosis not present

## 2018-09-11 DIAGNOSIS — Z86711 Personal history of pulmonary embolism: Secondary | ICD-10-CM | POA: Diagnosis not present

## 2018-09-11 DIAGNOSIS — M1990 Unspecified osteoarthritis, unspecified site: Secondary | ICD-10-CM | POA: Diagnosis not present

## 2018-09-11 DIAGNOSIS — R45851 Suicidal ideations: Secondary | ICD-10-CM | POA: Diagnosis not present

## 2018-09-11 DIAGNOSIS — E039 Hypothyroidism, unspecified: Secondary | ICD-10-CM | POA: Diagnosis not present

## 2018-09-11 DIAGNOSIS — K219 Gastro-esophageal reflux disease without esophagitis: Secondary | ICD-10-CM | POA: Diagnosis not present

## 2018-09-11 DIAGNOSIS — Z79899 Other long term (current) drug therapy: Secondary | ICD-10-CM | POA: Diagnosis not present

## 2018-09-11 DIAGNOSIS — L853 Xerosis cutis: Secondary | ICD-10-CM | POA: Diagnosis not present

## 2018-09-11 DIAGNOSIS — I10 Essential (primary) hypertension: Secondary | ICD-10-CM | POA: Diagnosis not present

## 2018-09-11 DIAGNOSIS — R41841 Cognitive communication deficit: Secondary | ICD-10-CM | POA: Diagnosis not present

## 2018-09-11 DIAGNOSIS — G20 Parkinson's disease: Secondary | ICD-10-CM | POA: Diagnosis not present

## 2018-09-11 DIAGNOSIS — Z8673 Personal history of transient ischemic attack (TIA), and cerebral infarction without residual deficits: Secondary | ICD-10-CM | POA: Diagnosis not present

## 2018-09-11 DIAGNOSIS — R4182 Altered mental status, unspecified: Secondary | ICD-10-CM | POA: Diagnosis not present

## 2018-09-11 DIAGNOSIS — G8929 Other chronic pain: Secondary | ICD-10-CM | POA: Diagnosis not present

## 2018-09-11 DIAGNOSIS — R279 Unspecified lack of coordination: Secondary | ICD-10-CM | POA: Diagnosis not present

## 2018-09-11 DIAGNOSIS — M549 Dorsalgia, unspecified: Secondary | ICD-10-CM | POA: Diagnosis not present

## 2018-09-11 DIAGNOSIS — M6281 Muscle weakness (generalized): Secondary | ICD-10-CM | POA: Diagnosis not present

## 2018-09-11 DIAGNOSIS — G9341 Metabolic encephalopathy: Secondary | ICD-10-CM | POA: Diagnosis not present

## 2018-09-11 DIAGNOSIS — G4700 Insomnia, unspecified: Secondary | ICD-10-CM | POA: Diagnosis not present

## 2018-09-11 DIAGNOSIS — C61 Malignant neoplasm of prostate: Secondary | ICD-10-CM | POA: Diagnosis not present

## 2018-09-11 DIAGNOSIS — R5381 Other malaise: Secondary | ICD-10-CM | POA: Diagnosis not present

## 2018-09-11 NOTE — Telephone Encounter (Signed)
Patient's daughter called regarding her dad and him being delirious. He is seeing things that are not there. She said she was instructed at the hospital not to give him his Seroquel. She said they need help as to what to do and give him? Please Call. Thanks

## 2018-09-11 NOTE — Telephone Encounter (Signed)
His management will be guided by the hospital physicians.  If infection has been ruled out and there is no QTc prolongation on EKG which the hospital team can check, then he can take low-dose seroquel 12.5mg  twice daily.  This was stopped because he was getting agitated, but with his new symptoms, can try low dose to see if it helps.

## 2018-09-11 NOTE — Telephone Encounter (Signed)
Please advise 

## 2018-09-11 NOTE — Telephone Encounter (Signed)
Santiago Glad given information per Dr. Posey Pronto.  She will let me know if they decide to start the low dose of Seroquel.  Patient has had EKG.

## 2018-09-11 NOTE — Telephone Encounter (Signed)
How long has patient been delirious?  First step is making sure that he does not have any infection (UTI, URI), so I recommend that he see his PCP to evaluate for this.  If he does not have infection, we need an updated EKG to decide which medication is safe to use.

## 2018-09-11 NOTE — Telephone Encounter (Signed)
Patient is in the hospital right now.

## 2018-09-12 DIAGNOSIS — R4182 Altered mental status, unspecified: Secondary | ICD-10-CM

## 2018-09-18 DIAGNOSIS — R269 Unspecified abnormalities of gait and mobility: Secondary | ICD-10-CM | POA: Diagnosis not present

## 2018-09-18 DIAGNOSIS — R4182 Altered mental status, unspecified: Secondary | ICD-10-CM | POA: Diagnosis not present

## 2018-09-18 DIAGNOSIS — G9341 Metabolic encephalopathy: Secondary | ICD-10-CM | POA: Diagnosis not present

## 2018-09-18 DIAGNOSIS — I10 Essential (primary) hypertension: Secondary | ICD-10-CM | POA: Diagnosis not present

## 2018-09-18 DIAGNOSIS — R41841 Cognitive communication deficit: Secondary | ICD-10-CM | POA: Diagnosis not present

## 2018-09-18 DIAGNOSIS — M1990 Unspecified osteoarthritis, unspecified site: Secondary | ICD-10-CM | POA: Diagnosis not present

## 2018-09-18 DIAGNOSIS — M6281 Muscle weakness (generalized): Secondary | ICD-10-CM | POA: Diagnosis not present

## 2018-09-18 DIAGNOSIS — C61 Malignant neoplasm of prostate: Secondary | ICD-10-CM | POA: Diagnosis not present

## 2018-09-18 DIAGNOSIS — R45851 Suicidal ideations: Secondary | ICD-10-CM | POA: Diagnosis not present

## 2018-09-18 DIAGNOSIS — L853 Xerosis cutis: Secondary | ICD-10-CM | POA: Diagnosis not present

## 2018-09-18 DIAGNOSIS — R609 Edema, unspecified: Secondary | ICD-10-CM | POA: Diagnosis not present

## 2018-09-18 DIAGNOSIS — R410 Disorientation, unspecified: Secondary | ICD-10-CM | POA: Diagnosis not present

## 2018-09-18 DIAGNOSIS — F329 Major depressive disorder, single episode, unspecified: Secondary | ICD-10-CM | POA: Diagnosis not present

## 2018-09-18 DIAGNOSIS — G20 Parkinson's disease: Secondary | ICD-10-CM | POA: Diagnosis not present

## 2018-09-18 DIAGNOSIS — Z86711 Personal history of pulmonary embolism: Secondary | ICD-10-CM | POA: Diagnosis not present

## 2018-09-18 DIAGNOSIS — E039 Hypothyroidism, unspecified: Secondary | ICD-10-CM | POA: Diagnosis not present

## 2018-09-18 DIAGNOSIS — R279 Unspecified lack of coordination: Secondary | ICD-10-CM | POA: Diagnosis not present

## 2018-09-18 DIAGNOSIS — K219 Gastro-esophageal reflux disease without esophagitis: Secondary | ICD-10-CM | POA: Diagnosis not present

## 2018-09-18 DIAGNOSIS — Z741 Need for assistance with personal care: Secondary | ICD-10-CM | POA: Diagnosis not present

## 2018-09-23 DIAGNOSIS — R410 Disorientation, unspecified: Secondary | ICD-10-CM | POA: Diagnosis not present

## 2018-09-23 DIAGNOSIS — G9341 Metabolic encephalopathy: Secondary | ICD-10-CM | POA: Diagnosis not present

## 2018-09-23 DIAGNOSIS — R45851 Suicidal ideations: Secondary | ICD-10-CM | POA: Diagnosis not present

## 2018-09-23 DIAGNOSIS — I10 Essential (primary) hypertension: Secondary | ICD-10-CM | POA: Diagnosis not present

## 2018-09-28 ENCOUNTER — Other Ambulatory Visit: Payer: Self-pay | Admitting: *Deleted

## 2018-09-28 NOTE — Patient Outreach (Signed)
[facility]) Care Management  09/28/2018  TANDY LEWIN 03-01-1946 [id number]   Facility site visit at [facility] and spoke with Olegario Shearer discharge planner  to discuss patient's progress and plan for transitioning to home. Olegario Shearer stated patient is inconsistent with cognition and patient's main caregiver is his spouse.  Olegario Shearer stated she has a meeting scheduled with the spouse later this week to discuss patient's discharge needs.  Made Olegario Shearer aware I would put a [facility] Packet with contact information in patient's room for the spouse to look over.   Went to patient's room .  Patient noted to be pleasantly confused awake resting in bed. He was adjusting and readjusting pillows.  Left [facility] packet with [facility] contact information for spouse.   Plan to see patient at nest site visit.   Rutherford Limerick RN, BSN [place] Acute Care Coordinator [phone]) Business Mobile [phone]) Toll free office

## 2018-09-29 ENCOUNTER — Telehealth: Payer: Self-pay | Admitting: Neurology

## 2018-09-29 DIAGNOSIS — E039 Hypothyroidism, unspecified: Secondary | ICD-10-CM | POA: Diagnosis not present

## 2018-09-29 DIAGNOSIS — G9341 Metabolic encephalopathy: Secondary | ICD-10-CM | POA: Diagnosis not present

## 2018-09-29 DIAGNOSIS — R410 Disorientation, unspecified: Secondary | ICD-10-CM | POA: Diagnosis not present

## 2018-09-29 DIAGNOSIS — I10 Essential (primary) hypertension: Secondary | ICD-10-CM | POA: Diagnosis not present

## 2018-09-29 NOTE — Telephone Encounter (Signed)
Patient's daughter called and said that her father was just released from the hospital. She said they are wanting him screened for Dementia. He is scheduled for a Follow Up in March. Should he be placed on the wait list or seen sooner? His daughter is also wanting to make sure that he is on the right Dosage for Seroquel? Please Advise. Thanks

## 2018-09-30 NOTE — Telephone Encounter (Signed)
I spoke with patient's daughter and she said that her dad was d/c'd from the hospital with no seroquel.  She thinks they had him on a higher dose while in the hospital than what we had him on.  She says that he needs a new Rx but does not know what dose he needs to be on.  Please advise.  Patient is currently at [facility] rehab facility.

## 2018-09-30 NOTE — Telephone Encounter (Signed)
I called back and gave the verbal order per Dr. Posey Pronto.

## 2018-09-30 NOTE — Telephone Encounter (Signed)
Patient daughter is very concerned about her father and the fact that he is going down hill.please call patient daughter at [phone]

## 2018-09-30 NOTE — Telephone Encounter (Signed)
Disregard the note about the verbal order.  I called and spoke with Patrick Osborn at [facility] and informed her that we do not have the discharge orders from [facility] so she read them and said that he was not d/c'd with seroquel.  She said that he had seen the PA at the facility yesterday and she was waiting for neuropsych to evaluate him on Friday before she prescribed anything.  She said that she would talk to the PA again and let me know what she decides.  She called back and said that the PA is going to give him seroquel 25 mg at night since he up and pacing during the night.  Patrick Osborn will call the daughter to let her know the plan.

## 2018-09-30 NOTE — Telephone Encounter (Signed)
My last telephone note on 12/27 indicates plan to start seroquel 12.5mg  twice daily as needed for agitation, IF the hospital team deemed it necessary. Please try to get discharge summary, so I can get more information.  If agitation is not an issue, no need to be on seroquel.

## 2018-10-12 DIAGNOSIS — Z5181 Encounter for therapeutic drug level monitoring: Secondary | ICD-10-CM | POA: Diagnosis not present

## 2018-10-12 DIAGNOSIS — Z7901 Long term (current) use of anticoagulants: Secondary | ICD-10-CM | POA: Diagnosis not present

## 2018-10-13 ENCOUNTER — Other Ambulatory Visit: Payer: Self-pay

## 2018-10-13 DIAGNOSIS — G20 Parkinson's disease: Secondary | ICD-10-CM | POA: Diagnosis not present

## 2018-10-13 DIAGNOSIS — F329 Major depressive disorder, single episode, unspecified: Secondary | ICD-10-CM | POA: Diagnosis not present

## 2018-10-13 DIAGNOSIS — Z8546 Personal history of malignant neoplasm of prostate: Secondary | ICD-10-CM | POA: Diagnosis not present

## 2018-10-13 DIAGNOSIS — G8929 Other chronic pain: Secondary | ICD-10-CM | POA: Diagnosis not present

## 2018-10-13 DIAGNOSIS — Z7901 Long term (current) use of anticoagulants: Secondary | ICD-10-CM | POA: Diagnosis not present

## 2018-10-13 DIAGNOSIS — I5032 Chronic diastolic (congestive) heart failure: Secondary | ICD-10-CM | POA: Diagnosis not present

## 2018-10-13 DIAGNOSIS — G9341 Metabolic encephalopathy: Secondary | ICD-10-CM | POA: Diagnosis not present

## 2018-10-13 DIAGNOSIS — M47816 Spondylosis without myelopathy or radiculopathy, lumbar region: Secondary | ICD-10-CM | POA: Diagnosis not present

## 2018-10-13 DIAGNOSIS — G629 Polyneuropathy, unspecified: Secondary | ICD-10-CM | POA: Diagnosis not present

## 2018-10-13 DIAGNOSIS — E039 Hypothyroidism, unspecified: Secondary | ICD-10-CM | POA: Diagnosis not present

## 2018-10-13 DIAGNOSIS — L853 Xerosis cutis: Secondary | ICD-10-CM | POA: Diagnosis not present

## 2018-10-13 DIAGNOSIS — Z86711 Personal history of pulmonary embolism: Secondary | ICD-10-CM | POA: Diagnosis not present

## 2018-10-13 DIAGNOSIS — M1991 Primary osteoarthritis, unspecified site: Secondary | ICD-10-CM | POA: Diagnosis not present

## 2018-10-13 DIAGNOSIS — I4891 Unspecified atrial fibrillation: Secondary | ICD-10-CM | POA: Diagnosis not present

## 2018-10-13 DIAGNOSIS — I110 Hypertensive heart disease with heart failure: Secondary | ICD-10-CM | POA: Diagnosis not present

## 2018-10-13 NOTE — Patient Outreach (Signed)
Transition of care: Notification from [facility] office that patient was discharged on 10/08/2018.  Placed call to patients home. No answer. Left a message requesting a call back.  PLAN: will mail outreach letter and attempt again in 3 days.  Tomasa Rand, RN, BSN, CEN [facility] [employer] [phone]

## 2018-10-13 NOTE — Patient Outreach (Signed)
Transition of care:  Incoming call from patient's wife. Reviewed reason for call. Reviewed [facility] program and wife reports she is not sure of interest in program. Reports [facility] health nurse is coming today. Reviewed differences in [facility] and home health with wife.  Wife states that she will call back and let me now if patient or she is interested in services. Wife reports follow up completed with primary MD yesterday. Reports she manages all medications.  PLAN: encouraged wife to call me back this week and let me know if she is interested. Reviewed with wife I have mailed a letter. Will await a call back.  Tomasa Rand, RN, BSN, CEN [facility] [employer] [phone]

## 2018-10-15 ENCOUNTER — Telehealth: Payer: Self-pay | Admitting: Neurology

## 2018-10-15 NOTE — Telephone Encounter (Signed)
Patient daughter called and wants to speak to someone about father having problems with sleep please call

## 2018-10-15 NOTE — Telephone Encounter (Signed)
I spoke with patient's daughter and instructed her to increase the Seroquel to 25 mg at night per Dr. Posey Pronto.  She said that her dad is home now and his medications are the same.  Last EKG was in December.  Dr. Posey Pronto notified.  I will send [facility] referral.

## 2018-10-15 NOTE — Telephone Encounter (Signed)
Patient's daughter said that he is staying up all night still.  He is taking Seroquel 12.5 mg bid.  Can it be increased and can he add melatonin?

## 2018-10-15 NOTE — Telephone Encounter (Signed)
Last telephone note indicates patient was going to be evaluated by neuropsychology and they were going to make recommendations for seroquel.  Can you request updated MAR faxed to me so I can see what he is taking?  His seroquel can be increased to 25mg  at bedtime, if needed, but I want to be sure there are not conflicting orders.  Also, request recent EKG to review QTc.  Josias Tomerlin K. Posey Pronto, DO

## 2018-10-16 ENCOUNTER — Ambulatory Visit: Payer: Self-pay

## 2018-10-16 DIAGNOSIS — G20 Parkinson's disease: Secondary | ICD-10-CM | POA: Diagnosis not present

## 2018-10-16 DIAGNOSIS — E538 Deficiency of other specified B group vitamins: Secondary | ICD-10-CM | POA: Diagnosis not present

## 2018-10-16 DIAGNOSIS — E039 Hypothyroidism, unspecified: Secondary | ICD-10-CM | POA: Diagnosis not present

## 2018-10-16 DIAGNOSIS — Z79899 Other long term (current) drug therapy: Secondary | ICD-10-CM | POA: Diagnosis not present

## 2018-10-16 DIAGNOSIS — L89329 Pressure ulcer of left buttock, unspecified stage: Secondary | ICD-10-CM | POA: Diagnosis not present

## 2018-10-16 DIAGNOSIS — R5381 Other malaise: Secondary | ICD-10-CM | POA: Diagnosis not present

## 2018-10-16 DIAGNOSIS — I2699 Other pulmonary embolism without acute cor pulmonale: Secondary | ICD-10-CM | POA: Diagnosis not present

## 2018-10-16 DIAGNOSIS — R269 Unspecified abnormalities of gait and mobility: Secondary | ICD-10-CM | POA: Diagnosis not present

## 2018-10-16 DIAGNOSIS — R410 Disorientation, unspecified: Secondary | ICD-10-CM | POA: Diagnosis not present

## 2018-10-16 DIAGNOSIS — R7303 Prediabetes: Secondary | ICD-10-CM | POA: Diagnosis not present

## 2018-10-16 DIAGNOSIS — R5383 Other fatigue: Secondary | ICD-10-CM | POA: Diagnosis not present

## 2018-10-16 DIAGNOSIS — Z7901 Long term (current) use of anticoagulants: Secondary | ICD-10-CM | POA: Diagnosis not present

## 2018-10-19 ENCOUNTER — Other Ambulatory Visit: Payer: Self-pay

## 2018-10-20 ENCOUNTER — Other Ambulatory Visit: Payer: Self-pay

## 2018-10-20 NOTE — Patient Outreach (Signed)
Telephone assessment/transiton of care:  Placed call to patient and wife answered and states she can not talk and call back on 10/20/2018.  PLAN: call back on 10/20/2018.  Tomasa Rand, RN, BSN, CEN [facility] [employer] [phone]

## 2018-10-20 NOTE — Patient Outreach (Signed)
Telephone assessment/transtion of care:  Placed call to daughter Santiago Glad and explained reason for call and inquired about alternative phone number. Reviewed [facility] program and she will talk with her mother later today and call me back.  PLAN: await a call back.  Tomasa Rand, RN, BSN, CEN [facility] [employer] [phone]

## 2018-10-26 ENCOUNTER — Other Ambulatory Visit: Payer: Self-pay

## 2018-10-26 NOTE — Patient Outreach (Signed)
Telephone assessment; No call back from wife or daughter. Called wife back and reviewed [facility] program again. Wife states that she is not interested in program. Patient remains confused. Wife states that she has home health nurse 3 times per week and PT.  Reviewed with wife pending referral to neuropsych MD.   PLAN: close case as wife denies needs. Reviewed with wife that she has my contact information from previous letter and packet from [facility] and wife states she will call if she changes her mind. Encouraged wife to have some self care as she is exhausted from being caregiver.  Tomasa Rand, RN, BSN, CEN [facility] [employer] [phone]

## 2018-10-28 ENCOUNTER — Telehealth: Payer: Self-pay | Admitting: Neurology

## 2018-10-28 NOTE — Telephone Encounter (Signed)
Patient daughter states that [facility] in out of network for patient. It would cost them 100 every time he goes down there. She would like a referral to [facility] in [facility]

## 2018-10-28 NOTE — Telephone Encounter (Signed)
I will send referral.

## 2018-11-05 ENCOUNTER — Telehealth: Payer: Self-pay | Admitting: Neurology

## 2018-11-05 NOTE — Telephone Encounter (Signed)
Patient is scheduled for 3/23 at 3:30 and added to the waitlist. She said they were going to start taking him off medication any ways because he has no quality of life. So she asked for any advice is possible. Thanks!

## 2018-11-05 NOTE — Telephone Encounter (Signed)
Please call patient to schedule follow-up visit, he was last seen in September and needs to be reassessed.  Patrick B[place]house K. Posey Pronto, DO

## 2018-11-05 NOTE — Telephone Encounter (Signed)
Please advise 

## 2018-11-05 NOTE — Telephone Encounter (Signed)
Daughter left vm about getting off the parkinson's medication because of his confusion but were unsure how. She did not leave a call back number. Thanks!

## 2018-11-05 NOTE — Telephone Encounter (Signed)
Last visit was in September 2019 and I cannot make medication recommendations without evaluating him.  We will call him if there is a sooner appointment.

## 2018-11-13 ENCOUNTER — Other Ambulatory Visit: Payer: Self-pay

## 2018-11-13 DIAGNOSIS — R1084 Generalized abdominal pain: Secondary | ICD-10-CM | POA: Diagnosis not present

## 2018-11-13 DIAGNOSIS — R110 Nausea: Secondary | ICD-10-CM | POA: Diagnosis not present

## 2018-11-13 DIAGNOSIS — K5900 Constipation, unspecified: Secondary | ICD-10-CM | POA: Diagnosis not present

## 2018-11-13 DIAGNOSIS — E039 Hypothyroidism, unspecified: Secondary | ICD-10-CM | POA: Diagnosis not present

## 2018-11-13 DIAGNOSIS — Z79899 Other long term (current) drug therapy: Secondary | ICD-10-CM | POA: Diagnosis not present

## 2018-11-13 DIAGNOSIS — K219 Gastro-esophageal reflux disease without esophagitis: Secondary | ICD-10-CM | POA: Diagnosis not present

## 2018-11-13 DIAGNOSIS — R109 Unspecified abdominal pain: Secondary | ICD-10-CM | POA: Diagnosis not present

## 2018-11-13 NOTE — Patient Outreach (Signed)
Care Coordination/incoming call:  Voicemail received from daughter Santiago Glad, who states patient needs a pharmacy referral to help with times of medication administration.   PLAN: Will order a pharmacy referral.  Tomasa Rand, RN, BSN, CEN [place] Coordinator [phone]

## 2018-11-16 ENCOUNTER — Other Ambulatory Visit: Payer: Self-pay | Admitting: Pharmacist

## 2018-11-16 NOTE — Patient Outreach (Signed)
[facility]) Care Management  [place]   11/16/2018  KIPTON SKILLEN [birth year] [id number]  Reason for referral: Medication Management  Referral source: [facility] RN Current insurance:[employer]  PMHx includes but not limited to:  Parkinson's disease, delirium, hx bilateral PE o chronic anticoagulation, hypothyroidism, insomnia, vitamin B12 deficiency, GERD, obesity, OA, CHF.    Outreach: Unsuccessful telephone call attempt #1 to patient. HIPAA compliant voicemail left requesting a return call  Plan:  I will mail patient an unsuccessful outreach letter, I will make another outreach attempt to patient within 3-4 business days  Ralene Bathe, PharmD, [place] [phone]

## 2018-11-17 DIAGNOSIS — Z7901 Long term (current) use of anticoagulants: Secondary | ICD-10-CM | POA: Diagnosis not present

## 2018-11-17 DIAGNOSIS — K5904 Chronic idiopathic constipation: Secondary | ICD-10-CM | POA: Diagnosis not present

## 2018-11-17 DIAGNOSIS — G20 Parkinson's disease: Secondary | ICD-10-CM | POA: Diagnosis not present

## 2018-11-17 DIAGNOSIS — I2699 Other pulmonary embolism without acute cor pulmonale: Secondary | ICD-10-CM | POA: Diagnosis not present

## 2018-11-20 ENCOUNTER — Ambulatory Visit: Payer: Self-pay | Admitting: Pharmacist

## 2018-11-20 ENCOUNTER — Other Ambulatory Visit: Payer: Self-pay | Admitting: Pharmacist

## 2018-11-20 NOTE — Patient Outreach (Signed)
[facility]) Care Management  [place]  11/20/2018  MACIEJ SCHWEITZER 1946/02/15 [id number]   Reason for call: medication management (questions regarding administration times of medications)  Unsuccessful telephone call attempt #2 to patient.   HIPAA compliant voicemail left requesting a return call  Plan:  I will make another outreach attempt to patient within 3-4 business days   Ralene Bathe, PharmD, [place] [phone]

## 2018-11-26 ENCOUNTER — Other Ambulatory Visit: Payer: Self-pay | Admitting: Pharmacist

## 2018-11-26 ENCOUNTER — Ambulatory Visit: Payer: Self-pay | Admitting: Pharmacist

## 2018-11-26 NOTE — Patient Outreach (Signed)
[place]) Care Management  [place]  11/26/2018  Patrick Osborn May 05, 1946 [id number]  Reason for referral: Medication Management: questions about administration times of medicines  Referral source: [facility] RN Current insurance:[employer]  PMHx includes but not limited to:  Parkinson's disease, delirium, hx bilateral PE o chronic anticoagulation, hypothyroidism, insomnia, vitamin B12 deficiency, GERD, obesity, OA, CHF.    Outreach: Unsuccessful telephone call attempt #3 to patient.  Spoke with spouse who states she was the best person to contact regarding medications.  She is unable to talk right now but took my contact information and will call me back when able.    Plan: Will close [place] case at this time but am happy to assist if patient and / or family able to maintain contact.   Ralene Bathe, PharmD, [place] [phone]

## 2018-11-30 ENCOUNTER — Telehealth: Payer: Self-pay | Admitting: Neurology

## 2018-11-30 ENCOUNTER — Other Ambulatory Visit: Payer: Self-pay | Admitting: Pharmacist

## 2018-11-30 NOTE — Telephone Encounter (Signed)
Called patient's daughter back and instructed her to call patient's PCP.  She said that she already did and was instructed to call Dr. Posey Pronto.  She will let her mom know and call the PCP again.

## 2018-11-30 NOTE — Telephone Encounter (Signed)
Please advise 

## 2018-11-30 NOTE — Patient Outreach (Signed)
[facility]) Care Management  [place] 11/30/2018  ABBIE JABLON June 22, 1946 [id number]  Incoming call and voicemail received from patient's spouse.    Return call placed to patient and spouse.  Spouse answered call. HIPAA identifiers verified.   Spouse reports patient had difficulty sleeping last night and in general has needed to see his psychiatrist since January to resolve behavioral issues.  She reports he has an appt next week which was the earliest available when it was scheduled in January.  She reports patient may find a new psychiatrist that can see him faster for more acute issues.  She is tearful and states spouse can be difficult at night when he does not sleep.  She asks for recommendations on sleeping pills.  She also asks about side effects of Seroquel which patient recently started.    I reviewed Seroquel side effects and the high incidence of drowsiness.  Patient currently on a very low dose therefore may not be experiencing this yet.  We also discussed use of melatonin for sleep aide.  Reviewed with spouse that PCP had note from earlier today in [facility] regarding recommendation against additional "sleep pills."  Spouse asks about possible use of diphenhydramine.  I encouraged her to again reach out to her PCP about this specifically.  Spouse voiced understanding.  She is also hopeful for advise from psychiatrist next week.   She has my phone number if she needs to reach out to me again in the future.  I counseled spouse about resources for Care Giver Support.  Spouse denied wanting any material sent to her at this time .   Plan: Will keep [place] case closed at this time.   Ralene Bathe, PharmD, [place] [phone]

## 2018-11-30 NOTE — Telephone Encounter (Signed)
Daughter is calling in about patient not sleeping at all and needing something for him. They have tried the melatonin and it isnt working. Please call her back at [phone]. Thanks!

## 2018-11-30 NOTE — Telephone Encounter (Signed)
Please ask patient to contact their PCP for sleep-related issues.

## 2018-12-01 DIAGNOSIS — M545 Low back pain: Secondary | ICD-10-CM | POA: Diagnosis not present

## 2018-12-01 DIAGNOSIS — M7061 Trochanteric bursitis, right hip: Secondary | ICD-10-CM | POA: Diagnosis not present

## 2018-12-01 DIAGNOSIS — Z6835 Body mass index (BMI) 35.0-35.9, adult: Secondary | ICD-10-CM | POA: Diagnosis not present

## 2018-12-04 ENCOUNTER — Encounter: Payer: Self-pay | Admitting: Neurology

## 2018-12-04 ENCOUNTER — Ambulatory Visit: Payer: PPO | Admitting: Neurology

## 2018-12-04 ENCOUNTER — Other Ambulatory Visit: Payer: Self-pay

## 2018-12-04 VITALS — BP 100/60 | HR 66 | Temp 98.2°F | Ht 69.0 in | Wt 213.5 lb

## 2018-12-04 DIAGNOSIS — G4733 Obstructive sleep apnea (adult) (pediatric): Secondary | ICD-10-CM | POA: Diagnosis not present

## 2018-12-04 DIAGNOSIS — G4700 Insomnia, unspecified: Secondary | ICD-10-CM | POA: Diagnosis not present

## 2018-12-04 DIAGNOSIS — G20 Parkinson's disease: Secondary | ICD-10-CM | POA: Diagnosis not present

## 2018-12-04 DIAGNOSIS — F02818 Dementia in other diseases classified elsewhere, unspecified severity, with other behavioral disturbance: Secondary | ICD-10-CM | POA: Insufficient documentation

## 2018-12-04 DIAGNOSIS — F0281 Dementia in other diseases classified elsewhere with behavioral disturbance: Secondary | ICD-10-CM

## 2018-12-04 DIAGNOSIS — G3183 Dementia with Lewy bodies: Secondary | ICD-10-CM

## 2018-12-04 MED ORDER — QUETIAPINE FUMARATE 25 MG PO TABS: ORAL_TABLET | ORAL | 5 refills | Status: DC

## 2018-12-04 MED ORDER — DONEPEZIL HCL 10 MG PO TABS: ORAL_TABLET | ORAL | 5 refills | Status: AC

## 2018-12-04 NOTE — Progress Notes (Signed)
Follow-up Visit   Date: 12/04/18    CHUNG CHAGOYA MRN: [id number] DOB: 09-23-1945   Interim History: Patrick Osborn is a 73 y.o. right cerebellar stroke and PE (2014), GERD, atrial fibrillation, and hypothyroidism returning to the clinic for follow-up of Parkinson's disease.  The patient was accompanied to the clinic by daughter who also provides collateral information.    History of present illness: Starting in November 2018, he starting having low back pain which was not preceded by any injury.  Pain is localized over the right buttocks, hip, and involves his lateral thigh.  It is unrelenting, described as sharp and throbbing.  Pain is worse with raising the left up and walking. There is no numbness, tingling, burning, or electrical quality to his pain. He denies any bowel/bladder incontinence, cramps, or muscle spasms. He denies any weakness of the legs, but has noticed gait instability when upright and has been using a walker over the past week.  He has been seen by. Dr. Adin Hector, orthopeadics, who did a cortisone injection of the right hip for bursitis, but there was no improvement.  He was had MRI lumbar spine on 3/31 and was premedicated with ativan, but developed severe cognitive changes and became incoherent.  He was hospitalized for acute mental status change at [facility] 3/31/201 - 12/19/2017 and had CSF testing to evaluation for encephalitis, which was normal.  Medication side effect was suspected and he was discharged home on seroquel.  His wife has stopped seroquel because she felt it was making more more agitated at night.  MRI lumbar spine did not show any nerve impingement to explain right sided symptoms, there is foraminal stenosis on the left at L5-S1, which is asymptomatic.   UPDATE 06/08/2018:   At his last visit, he was started on Sinemet 1 tablet 3 times a day due to clinical suspicion for Parkinson's disease.  He has been taking 1 to 2 tablets daily, but has not  noticed a marked change.  His right hip pain was found to be secondary to degenerative changes of the spine and is followed by spine and scoliosis.  He has underwent a series of epidural steroid injections, with minimal benefit and was recommended surgery, which she is not keen on pursuing at this time.  He has not had any falls and walks unassisted.  UPDATE 12/04/2018:  He is here for 6 month follow-up visit with his daughter and wife.  Over the past several months, he has been having worsening hallucinations at night time and poor sleep. Daughter states that he can sleep 2-4 hours per night which makes him groggy during the day.  He has OSA and is noncompliant with using CPAP.  He is more confused, forgetful, and unable to do tasks such as correctly operate his lift chair.  Instead of using the push buttons to command it, he may try getting out of the chair over the armrest and then fall.  His wife is primary caregiver to manages medications and helps with ADLs.  He is taking seroquel 12.5mg  twice daily, but they have noticed no benefit.    Medications:  Current Outpatient Medications on File Prior to Visit  Medication Sig Dispense Refill  . carbidopa-levodopa (SINEMET IR) 25-100 MG tablet Take 1 tablet by mouth 3 (three) times daily. 270 tablet 3  . carvedilol (COREG) 6.25 MG tablet TK 1 T PO BID  9  . gabapentin (NEURONTIN) 300 MG capsule 300 mg 3 (three) times daily.  4  . levothyroxine (SYNTHROID, LEVOTHROID) 25 MCG tablet Take by mouth.    . pantoprazole (PROTONIX) 40 MG tablet Take 40 mg by mouth daily.    Marland Kitchen warfarin (COUMADIN) 4 MG tablet Take 4 mg by mouth daily. 4 mg T, Th, S, Sun    . warfarin (COUMADIN) 6 MG tablet Take 7-7.5 mg by mouth See admin instructions. 6 mg M, W, F     No current facility-administered medications on file prior to visit.     Allergies:  Allergies  Allergen Reactions  . Triamcinolone Acetonide Other (See Comments)    Review of Systems:  CONSTITUTIONAL:  No fevers, chills, night sweats, or weight loss.  EYES: No visual changes or eye pain ENT: No hearing changes.  No history of nose bleeds.   RESPIRATORY: No cough, wheezing and shortness of breath.   CARDIOVASCULAR: Negative for chest pain, and palpitations.   GI: Negative for abdominal discomfort, blood in stools or black stools.  No recent change in bowel habits.   GU:  No history of incontinence.   MUSCLOSKELETAL: +history of joint pain or swelling.  No myalgias.   SKIN: Negative for lesions, rash, and itching.   ENDOCRINE: Negative for cold or heat intolerance, polydipsia or goiter.   PSYCH:  + depression or anxiety symptoms.   NEURO: As Above.   Vital Signs:  BP 100/60   Pulse 66   Temp 98.2 F (36.8 C) (Oral)   Ht 5\' 9"  (1.753 m)   Wt 213 lb 8 oz (96.8 kg)   SpO2 94%   BMI 31.53 kg/m   General Medical Exam:   General:  Well appearing, comfortable, severely blunted affect.   Neurological Exam: MENTAL STATUS including orientation to time, place, person, recent and remote memory, attention span and concentration, language, and fund of knowledge is fair.  Confabulates at times.  Speech is not dysarthric, hypophonic.  CRANIAL NERVES:  Pupils equal round and reactive to light.  Normal conjugate, extra-ocular eye movements in all directions of gaze.  Right eye with entropion.   Face is symmetric. Palate elevates symmetrically.  Tongue is midline.  MOTOR:  Motor strength is 5/5 in all extremities.  Resting tremor of the right hand.  There is increased tone of the right hand and leg.  Tone of the left side is normal.   COORDINATION/GAIT:  Normal finger-to- nose-finger.  Bradykinesia with finger tapping and heel tapping on the right.  Gait narrow based, absent arm swing, unassisted, and unsteady with turns.  Data: MRI lumbar spine wo contrast 12/14/2017: 1. Bulky right far-lateral endplate spurs at O1-3 and L2-3. There is superimposed edematous signal at L1-2 and bridging at L2-3.  2. Generalized mild to moderate disc degeneration. Disc height loss and bulge causes moderate left foraminal narrowing at L5-S1. 3. Degenerative facet arthropathy mainly at L4-5 and L5-S1. 4. Sacroiliac ankylosis bilaterally, likely degenerative given associated spurring.  IMPRESSION/PLAN: 1.  Idiopathic Parkinson's disease manifesting with bradykinesia, right hand tremor, and severely blunted affect.  Stable  - Continue Sinemet 25/100 1 tablet 3 times daily  - Encouraged him to do chair exercisess  2.  Lewy body dementia, worsening  - Start aricept 5mg  daily x 1 month and titrate to 10mg  daily  - Increase seroquel to 25mg  at bedtime, continue 12.5mg  during the day.  Patient and family made aware of black box warning with any antipsychotic medications used to manage behavior.    - If there is plans to titrate seroquel in the future,  he will need updated EKG  - Family had many questions regarding management of his cognitive changes which were answered to the best of my ability  3.  OSA, Insomnia  - Refer to [facility] for assistance with sleep-related conditions  Return to clinic in 4 months  Greater than 50% of this 40 minute visit was spent in counseling, explanation of diagnosis, planning of further management, and coordination of care.    Thank you for allowing me to participate in patient's care.  If I can answer any additional questions, I would be pleased to do so.    Sincerely,    Finnigan Warriner K. Posey Pronto, DO

## 2018-12-04 NOTE — Patient Instructions (Addendum)
Increase seroquel to 25mg  at bedtime, continue 12.5mg  in the morning  Continue sinemet 1 tablet three times daily  Start Aricept 5mg  (half-tab) for one month, then increase 10mg  daily  Referral to [facility] for insomnia and OSA  Return to clinic in 4 months

## 2018-12-07 ENCOUNTER — Ambulatory Visit: Payer: PPO | Admitting: Neurology

## 2018-12-07 DIAGNOSIS — Z79899 Other long term (current) drug therapy: Secondary | ICD-10-CM | POA: Diagnosis not present

## 2018-12-07 DIAGNOSIS — Z5181 Encounter for therapeutic drug level monitoring: Secondary | ICD-10-CM | POA: Diagnosis not present

## 2018-12-10 ENCOUNTER — Other Ambulatory Visit: Payer: Self-pay

## 2018-12-10 NOTE — Patient Outreach (Signed)
[facility]) Care Management  12/10/2018  MCCAULEY DIEHL 21-Jan-1946 [id number]   Telephone Screen  Referral Date: 12/10/2018 Referral Source: Nurse call Center Referral Reason: "12/09/2018-8:05pm-caller states her father is in severe pain-abd & back, has worsened confusion" Nurse Advice: "go to ED now" Insurance: HTA   Outreach attempt # 1 to patient. . No answer. RN CM left HIPAA compliant voicemail message along with contact info. RN CM attempted daughter's number and no answer at present as well.      Plan: RN CM will will make outreach attempt to patient within 3-4 business days. RN CM will send unsuccessful outreach letter to patient.   Enzo Montgomery, RN,BSN,CCM [facility] Telephonic Care Management Coordinator Direct Phone: [phone] Toll Free: [phone] Fax: [phone]

## 2018-12-10 NOTE — Patient Outreach (Signed)
Patrick Osborn) Care Management  12/10/2018  [name] 1945-11-11 [id number]   Telephone Screen  Referral Date: 12/10/2018 Referral Source: Nurse call [facility] Referral Reason: "12/09/2018-8:05 pm-caller states her father is in severe pain-abd & back, has worsened confusion" Nurse Advice: "go to ED now" Insurance: HTA   Incoming call from spouse returning RN CM call for patient. She voices that patient is still asleep. She reports that they dud not take patient to the ED as advised. Spouse voices that they are "tired" taking patient to the ED every time he has these episodes. Spouse states that patient is has gotten some injections several weeks ago that helped with pain. She is unsure if patient will get any more. RN CM advised spouse to follow up with MD regarding her concerns with patient's pain and to discuss pain mgmt goals and plan of care with provider. She reports that currently patient taking Gabapentin daily and occasional Tylenol PM. She voices that Tylenol PM does help him rest better but admits she does not give it to patient often. Encouraged spouse to try this. Also suggested to spouse to discuss possible pain mgmt and/or palliative care referral with PCP to help better manage patient's symptoms. She denies any further RN CM or [facility] needs or concerns at this time. Spouse advised to feel free to call 24hr Nurse Line for any future needs or concerns. She voiced understanding and was appreciative of follow up call.    Plan: RN CM will close case at this time.    Enzo Montgomery, RN,BSN,CCM [facility] Telephonic Care Management Coordinator Direct Phone: [phone] Toll Free: [phone] Fax: [phone]

## 2018-12-30 ENCOUNTER — Telehealth: Payer: Self-pay | Admitting: Neurology

## 2018-12-30 NOTE — Telephone Encounter (Signed)
I spoke with Santiago Glad and she said that her dad has gotten worse since increasing the seroquel.  They went down to 1/2 at night and now they would like to stop it because it is not working.  They decreased his parkinsons medication and then forgot to give it to him for a day and his mind cleared up but his walking is worse.  She said that he did not recognize his wife while on medication.  He is taking tylenol pm and it is helping with sleep.   Any suggestions?

## 2018-12-30 NOTE — Telephone Encounter (Signed)
Patient's daughter called regarding patient no knowing who his wife was and when they decreased his Parkinson's medication he seemed to do better and "his mind Cleared up". She also said one of his medications was increased in March and it made him worse and he was also supposed to start a new medication. She said her mom forgot to give him his medication for a couple days and he seemed to do better. She is wondering if there is an alternative medication? Please Call. Thanks

## 2018-12-31 NOTE — Telephone Encounter (Signed)
Attempted to contact patient's daughter to set up e-visit.  No answer and mailbox is full.

## 2018-12-31 NOTE — Telephone Encounter (Signed)
Please get the exact name and dose of the medications that she is referring to.  My last note states he should be on: 1.  Sinemet 1 tablet three times daily for parkinson's disease 2.  Aricept 5mg  for a month, then 10mg  daily for memory 3.  Seroquel 12.5mg  in the morning and 25mg  at bedtime for agitation and hallucinations.  Which of these is patient taking?  It may be easier to consider e-visit, if they would like.

## 2019-01-01 NOTE — Telephone Encounter (Signed)
I will attempt to call the daughter again and let the front desk know that he needs e visit.

## 2019-01-12 DIAGNOSIS — E039 Hypothyroidism, unspecified: Secondary | ICD-10-CM | POA: Diagnosis not present

## 2019-01-12 DIAGNOSIS — E6609 Other obesity due to excess calories: Secondary | ICD-10-CM | POA: Diagnosis not present

## 2019-01-12 DIAGNOSIS — R410 Disorientation, unspecified: Secondary | ICD-10-CM | POA: Diagnosis not present

## 2019-01-12 DIAGNOSIS — M461 Sacroiliitis, not elsewhere classified: Secondary | ICD-10-CM | POA: Diagnosis not present

## 2019-01-12 DIAGNOSIS — M8949 Other hypertrophic osteoarthropathy, multiple sites: Secondary | ICD-10-CM | POA: Diagnosis not present

## 2019-01-12 DIAGNOSIS — I482 Chronic atrial fibrillation, unspecified: Secondary | ICD-10-CM | POA: Diagnosis not present

## 2019-01-12 DIAGNOSIS — I2699 Other pulmonary embolism without acute cor pulmonale: Secondary | ICD-10-CM | POA: Diagnosis not present

## 2019-01-12 DIAGNOSIS — I82509 Chronic embolism and thrombosis of unspecified deep veins of unspecified lower extremity: Secondary | ICD-10-CM | POA: Diagnosis not present

## 2019-01-12 DIAGNOSIS — Z7901 Long term (current) use of anticoagulants: Secondary | ICD-10-CM | POA: Diagnosis not present

## 2019-01-12 DIAGNOSIS — Z515 Encounter for palliative care: Secondary | ICD-10-CM | POA: Diagnosis not present

## 2019-01-12 DIAGNOSIS — M5136 Other intervertebral disc degeneration, lumbar region: Secondary | ICD-10-CM | POA: Diagnosis not present

## 2019-01-12 DIAGNOSIS — G20 Parkinson's disease: Secondary | ICD-10-CM | POA: Diagnosis not present

## 2019-01-12 DIAGNOSIS — M48061 Spinal stenosis, lumbar region without neurogenic claudication: Secondary | ICD-10-CM | POA: Diagnosis not present

## 2019-01-14 ENCOUNTER — Telehealth (INDEPENDENT_AMBULATORY_CARE_PROVIDER_SITE_OTHER): Payer: PPO | Admitting: Neurology

## 2019-01-14 ENCOUNTER — Encounter: Payer: Self-pay | Admitting: *Deleted

## 2019-01-14 ENCOUNTER — Other Ambulatory Visit: Payer: Self-pay

## 2019-01-14 ENCOUNTER — Encounter: Payer: Self-pay | Admitting: Neurology

## 2019-01-14 VITALS — Ht 69.0 in | Wt 213.0 lb

## 2019-01-14 DIAGNOSIS — G20 Parkinson's disease: Secondary | ICD-10-CM | POA: Diagnosis not present

## 2019-01-14 DIAGNOSIS — G3183 Dementia with Lewy bodies: Secondary | ICD-10-CM

## 2019-01-14 DIAGNOSIS — F02818 Dementia in other diseases classified elsewhere, unspecified severity, with other behavioral disturbance: Secondary | ICD-10-CM

## 2019-01-14 DIAGNOSIS — F0281 Dementia in other diseases classified elsewhere with behavioral disturbance: Secondary | ICD-10-CM

## 2019-01-14 MED ORDER — CARBIDOPA-LEVODOPA 25-100 MG PO TABS: 0.5000 | ORAL_TABLET | Freq: Two times a day (BID) | ORAL | 3 refills | Status: AC

## 2019-01-14 NOTE — Progress Notes (Signed)
Virtual Visit via Video Note The purpose of this virtual visit is to provide medical care while limiting exposure to the novel coronavirus.    Consent was obtained for video visit:  Yes.   Answered questions that patient had about telehealth interaction:  Yes.   I discussed the limitations, risks, security and privacy concerns of performing an evaluation and management service by telemedicine. I also discussed with the patient that there may be a patient responsible charge related to this service. The patient expressed understanding and agreed to proceed.  Pt location: Home Physician Location: office Name of referring provider:  Raina Mina., MD I connected with Patrick Osborn at patients initiation/request on 01/14/2019 at  9:00 AM EDT by video enabled telemedicine application and verified that I am speaking with the correct person using two identifiers. Pt MRN:  [id number] Pt DOB:  Dec 01, 1945 Video Participants:  Patrick Osborn;  Patrick Osborn (daughter)   History of Present Illness: This is a 73 y.o. male returning for follow-up of parkinson's disease and Lewy Body Dementia.  At his last visit on March 20, I recommended Seroquel for agitation and hallucinations, Sinemet 1 tablet 3 times daily for Parkinson's, and added Aricept for Lewy body dementia.  Family reports that he developed GI side effects with Sinemet and thus, is taking only 1 tablet daily.  They did not start Aricept due to concerns over potential side effects, given his history of intolerance to medications.  Seroquel was discontinued by family, because they felt it made him worse.  I have reviewed notes by his PCP, Dr. Bea Graff, who has discussed management options, including palliative care, which family has decided to pursue.  He was started on alprazolam 0.25mg  at bedtime for insomnia, and has been on this for 2 nights.  They do report that he slept well the second night.  No side effects.  He continues to be severely debilitated  from Parkinson's disease, Lewy body dementia, and chronic back pain.  Wife and daughter continue to manage medication and help with ADLs.  Mood is doing okay.  Sleep remains poor.  He does not have any new complaints for me today.   Observations/Objective:   Vitals:   01/14/19 0902  Weight: 213 lb (96.6 kg)  Height: 5\' 9"  (1.753 m)   Patient is awake, alert, and appears comfortable.  Severely blunted affect, does not engage in conversation.   Face is symmetric.  Speech is not dysarthric.  Antigravity in all extremities.   Severely reduced finger tapping bilaterally.  Gait is not tested.     Assessment and Plan:  1.  Parkinson's disease, moderate to advanced  -Due to GI side effects, patient reduced his Sinemet to once daily.  I have asked them to restart half a tablet twice daily and I will slowly titrate this.  -I do not recommend dopamine agonists due to risk of worsening hallucinations  2.  Lewy body dementia, moderate  -Start Aricept 5 mg daily for a month then increase to 10 mg daily which can help with hallucinations  -If he does not tolerate this, I will transition him to Exelon patch  -D/C seroquel  -Consider abilify going forward  -Started recently xanax 0.25mg  by PCP.  If needed, trazadone may be another option   Follow Up Instructions:   I discussed the assessment and treatment plan with the patient. The patient was provided an opportunity to ask questions and all were answered. The patient agreed with the plan and demonstrated  an understanding of the instructions.   The patient was advised to call back or seek an in-person evaluation if the symptoms worsen or if the condition fails to improve as anticipated.  Follow-up in 1 month  Total time spent:  30 minutes     Alda Berthold, DO

## 2019-01-16 DIAGNOSIS — Z66 Do not resuscitate: Secondary | ICD-10-CM | POA: Diagnosis not present

## 2019-01-16 DIAGNOSIS — R4689 Other symptoms and signs involving appearance and behavior: Secondary | ICD-10-CM | POA: Diagnosis not present

## 2019-01-16 DIAGNOSIS — Z8546 Personal history of malignant neoplasm of prostate: Secondary | ICD-10-CM | POA: Diagnosis not present

## 2019-01-16 DIAGNOSIS — I110 Hypertensive heart disease with heart failure: Secondary | ICD-10-CM | POA: Diagnosis not present

## 2019-01-16 DIAGNOSIS — Z86718 Personal history of other venous thrombosis and embolism: Secondary | ICD-10-CM | POA: Diagnosis not present

## 2019-01-16 DIAGNOSIS — Z7901 Long term (current) use of anticoagulants: Secondary | ICD-10-CM | POA: Diagnosis not present

## 2019-01-16 DIAGNOSIS — G20 Parkinson's disease: Secondary | ICD-10-CM | POA: Diagnosis not present

## 2019-01-16 DIAGNOSIS — E669 Obesity, unspecified: Secondary | ICD-10-CM | POA: Diagnosis not present

## 2019-01-16 DIAGNOSIS — Z8673 Personal history of transient ischemic attack (TIA), and cerebral infarction without residual deficits: Secondary | ICD-10-CM | POA: Diagnosis not present

## 2019-01-16 DIAGNOSIS — J9811 Atelectasis: Secondary | ICD-10-CM | POA: Diagnosis not present

## 2019-01-16 DIAGNOSIS — Z86711 Personal history of pulmonary embolism: Secondary | ICD-10-CM | POA: Diagnosis not present

## 2019-01-16 DIAGNOSIS — G8929 Other chronic pain: Secondary | ICD-10-CM | POA: Diagnosis not present

## 2019-01-16 DIAGNOSIS — I5022 Chronic systolic (congestive) heart failure: Secondary | ICD-10-CM | POA: Diagnosis not present

## 2019-01-16 DIAGNOSIS — Z95828 Presence of other vascular implants and grafts: Secondary | ICD-10-CM | POA: Diagnosis not present

## 2019-01-16 DIAGNOSIS — M549 Dorsalgia, unspecified: Secondary | ICD-10-CM | POA: Diagnosis not present

## 2019-01-16 DIAGNOSIS — R109 Unspecified abdominal pain: Secondary | ICD-10-CM | POA: Diagnosis not present

## 2019-01-16 DIAGNOSIS — R4182 Altered mental status, unspecified: Secondary | ICD-10-CM | POA: Diagnosis not present

## 2019-01-16 DIAGNOSIS — R410 Disorientation, unspecified: Secondary | ICD-10-CM | POA: Diagnosis not present

## 2019-01-16 DIAGNOSIS — F0281 Dementia in other diseases classified elsewhere with behavioral disturbance: Secondary | ICD-10-CM | POA: Diagnosis not present

## 2019-01-16 DIAGNOSIS — Z515 Encounter for palliative care: Secondary | ICD-10-CM | POA: Diagnosis not present

## 2019-01-16 DIAGNOSIS — I482 Chronic atrial fibrillation, unspecified: Secondary | ICD-10-CM | POA: Diagnosis not present

## 2019-01-16 DIAGNOSIS — F0280 Dementia in other diseases classified elsewhere without behavioral disturbance: Secondary | ICD-10-CM | POA: Diagnosis not present

## 2019-01-16 DIAGNOSIS — G3183 Dementia with Lewy bodies: Secondary | ICD-10-CM | POA: Diagnosis not present

## 2019-01-16 DIAGNOSIS — E039 Hypothyroidism, unspecified: Secondary | ICD-10-CM | POA: Diagnosis not present

## 2019-01-16 DIAGNOSIS — F19950 Other psychoactive substance use, unspecified with psychoactive substance-induced psychotic disorder with delusions: Secondary | ICD-10-CM | POA: Diagnosis not present

## 2019-01-16 DIAGNOSIS — Z79899 Other long term (current) drug therapy: Secondary | ICD-10-CM | POA: Diagnosis not present

## 2019-01-16 DIAGNOSIS — D72829 Elevated white blood cell count, unspecified: Secondary | ICD-10-CM | POA: Diagnosis not present

## 2019-01-16 DIAGNOSIS — F29 Unspecified psychosis not due to a substance or known physiological condition: Secondary | ICD-10-CM | POA: Diagnosis not present

## 2019-01-16 DIAGNOSIS — Z6830 Body mass index (BMI) 30.0-30.9, adult: Secondary | ICD-10-CM | POA: Diagnosis not present

## 2019-01-16 DIAGNOSIS — F05 Delirium due to known physiological condition: Secondary | ICD-10-CM | POA: Diagnosis not present

## 2019-01-16 DIAGNOSIS — K219 Gastro-esophageal reflux disease without esophagitis: Secondary | ICD-10-CM | POA: Diagnosis not present

## 2019-01-16 DIAGNOSIS — M48061 Spinal stenosis, lumbar region without neurogenic claudication: Secondary | ICD-10-CM | POA: Diagnosis not present

## 2019-01-17 DIAGNOSIS — I110 Hypertensive heart disease with heart failure: Secondary | ICD-10-CM | POA: Diagnosis not present

## 2019-01-17 DIAGNOSIS — I5022 Chronic systolic (congestive) heart failure: Secondary | ICD-10-CM | POA: Diagnosis not present

## 2019-01-17 DIAGNOSIS — Z86711 Personal history of pulmonary embolism: Secondary | ICD-10-CM | POA: Diagnosis not present

## 2019-01-17 DIAGNOSIS — G8929 Other chronic pain: Secondary | ICD-10-CM | POA: Diagnosis not present

## 2019-01-17 DIAGNOSIS — Z86718 Personal history of other venous thrombosis and embolism: Secondary | ICD-10-CM | POA: Diagnosis not present

## 2019-01-17 DIAGNOSIS — E039 Hypothyroidism, unspecified: Secondary | ICD-10-CM | POA: Diagnosis not present

## 2019-01-17 DIAGNOSIS — Z8673 Personal history of transient ischemic attack (TIA), and cerebral infarction without residual deficits: Secondary | ICD-10-CM | POA: Diagnosis not present

## 2019-01-17 DIAGNOSIS — M549 Dorsalgia, unspecified: Secondary | ICD-10-CM | POA: Diagnosis not present

## 2019-01-17 DIAGNOSIS — D72829 Elevated white blood cell count, unspecified: Secondary | ICD-10-CM | POA: Diagnosis not present

## 2019-01-17 DIAGNOSIS — F0280 Dementia in other diseases classified elsewhere without behavioral disturbance: Secondary | ICD-10-CM | POA: Diagnosis not present

## 2019-01-17 DIAGNOSIS — G20 Parkinson's disease: Secondary | ICD-10-CM | POA: Diagnosis not present

## 2019-01-17 DIAGNOSIS — R4182 Altered mental status, unspecified: Secondary | ICD-10-CM | POA: Diagnosis not present

## 2019-01-18 DIAGNOSIS — F0281 Dementia in other diseases classified elsewhere with behavioral disturbance: Secondary | ICD-10-CM | POA: Diagnosis not present

## 2019-01-18 DIAGNOSIS — G3183 Dementia with Lewy bodies: Secondary | ICD-10-CM | POA: Diagnosis not present

## 2019-01-27 ENCOUNTER — Telehealth: Payer: Self-pay | Admitting: Neurology

## 2019-01-27 NOTE — Telephone Encounter (Signed)
I personally called patient's daughter to express condolences.   Donika K. Posey Pronto, DO

## 2019-01-27 NOTE — Telephone Encounter (Signed)
-----   Message from Elenora Fender sent at 01/27/2019  9:41 AM EDT ----- Regarding: Deceased Hi Dr. Posey Pronto  Patient's daughter called this morning to cancel his appointments. He passed away on 02-09-19.  Thanks  [employer]

## 2019-02-15 DEATH — deceased

## 2019-02-18 ENCOUNTER — Telehealth: Payer: PPO | Admitting: Neurology

## 2019-04-05 ENCOUNTER — Ambulatory Visit: Payer: PPO | Admitting: Neurology

## 2019-09-02 IMAGING — MR MR LUMBAR SPINE W/O CM
4 of 5 series · 25 of 48 positions shown · non-contrast
Comparison: Abdominal CT 12/08/2017

CLINICAL DATA: Low back pain.  Right hip pain

EXAM:
MRI LUMBAR SPINE WITHOUT CONTRAST
TECHNIQUE: Multiplanar, multisequence MR imaging of the lumbar spine was
performed. No intravenous contrast was administered.

[Series 5: T2 post-contrast · sagittal · 4.0mm · 0.55mm/px · 6 of 17 slices shown]
[im 1/17]
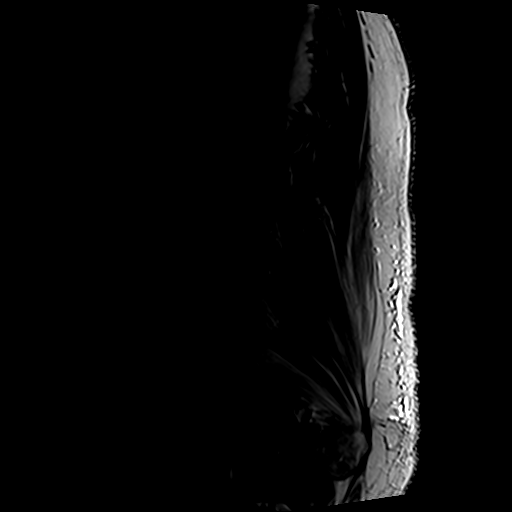
[im 4/17]
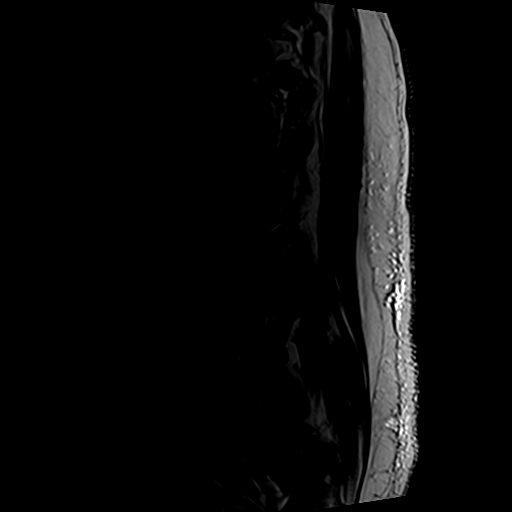
[im 7/17]
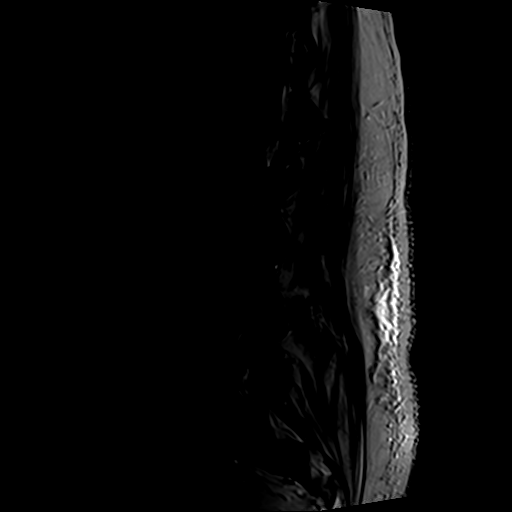
[im 10/17]
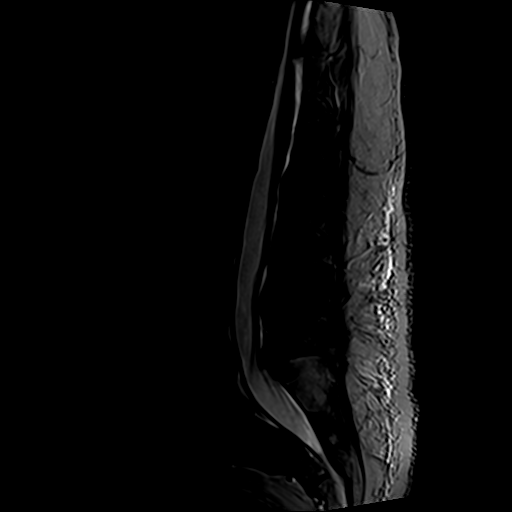
[im 13/17]
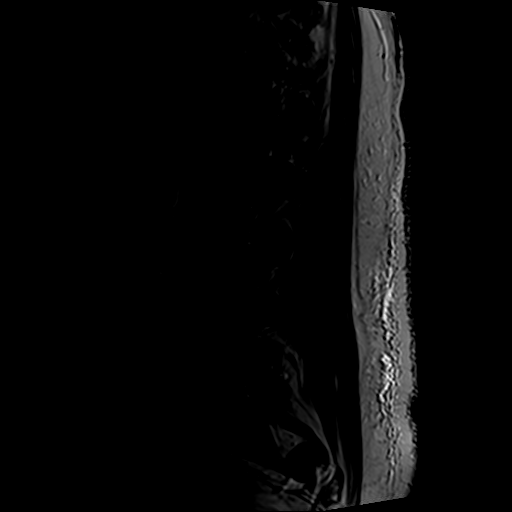
[im 17/17]
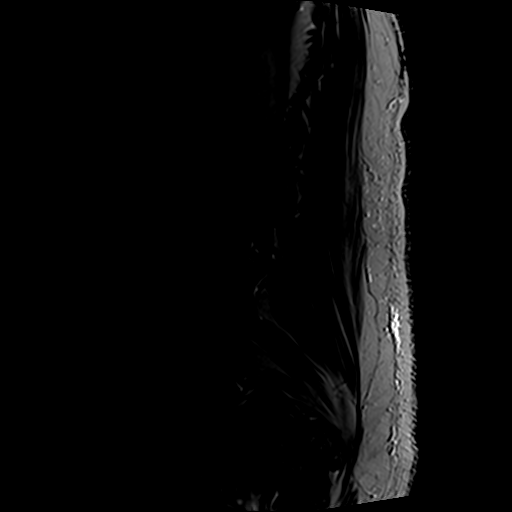

[Series 7: T1 · sagittal · 4.0mm · 0.55mm/px · 7 of 17 slices shown (1 of 2)]
[im 1/17]
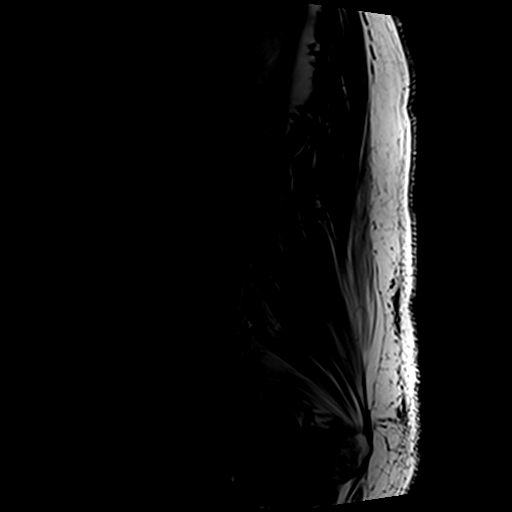
[im 3/17]
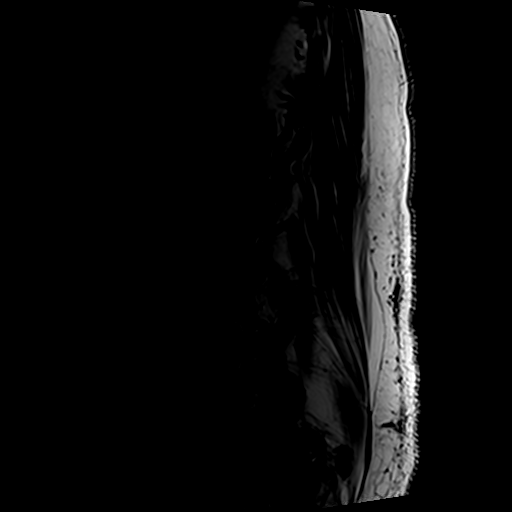
[im 6/17]
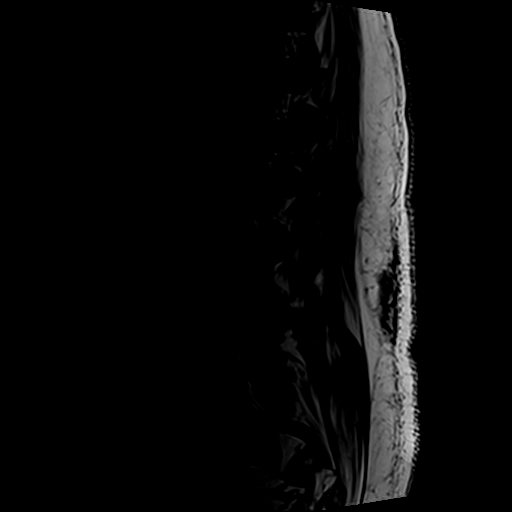
[im 9/17]
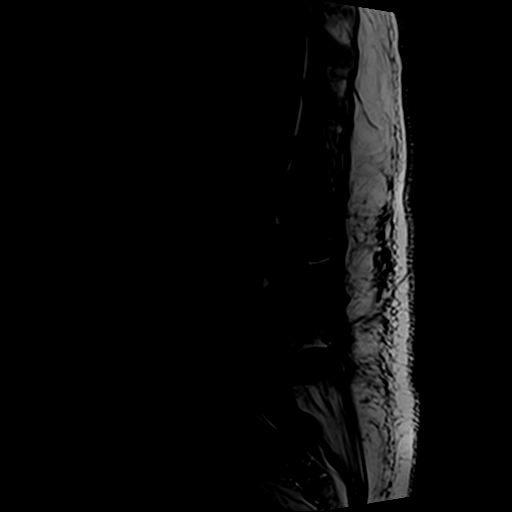
[im 11/17]
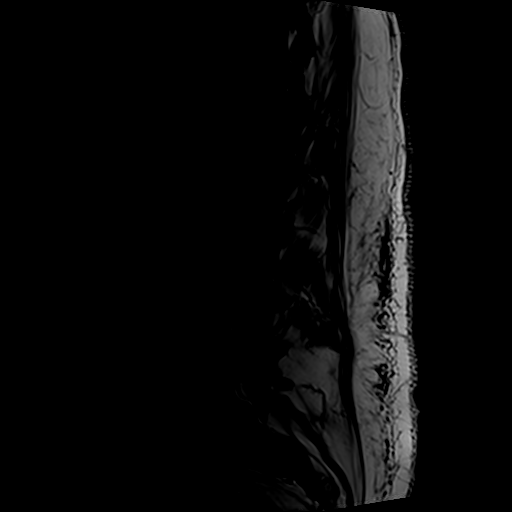
[im 14/17]
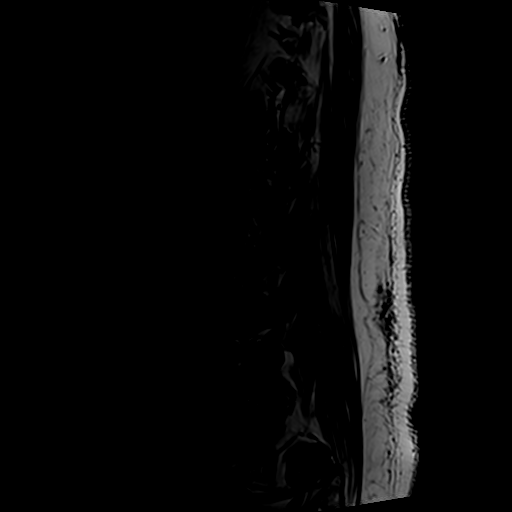
[im 17/17]
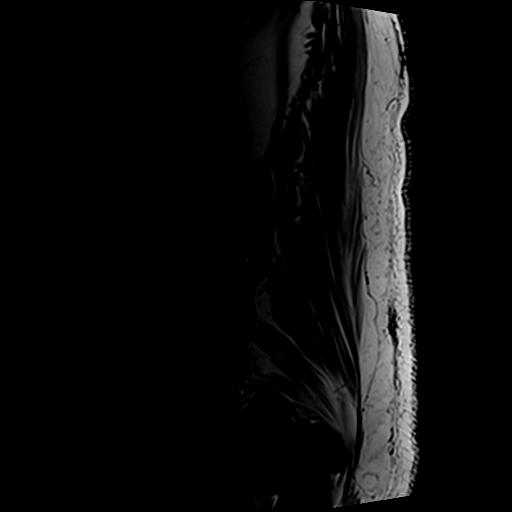

[Series 8: T1 · axial · 4.0mm · 0.35mm/px · z∈[-125,+48]mm · 4 of 35 slices shown (2 of 2)]
[im 1/35]
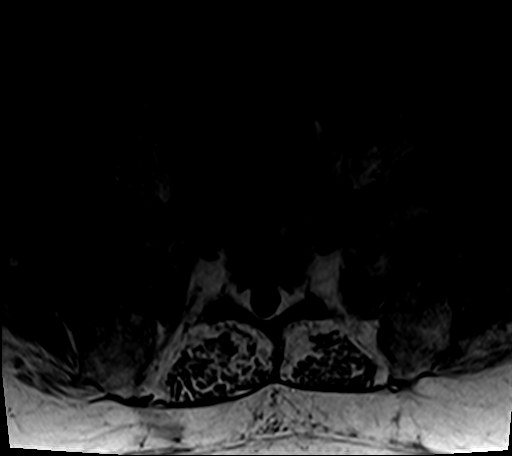
[im 6/35]
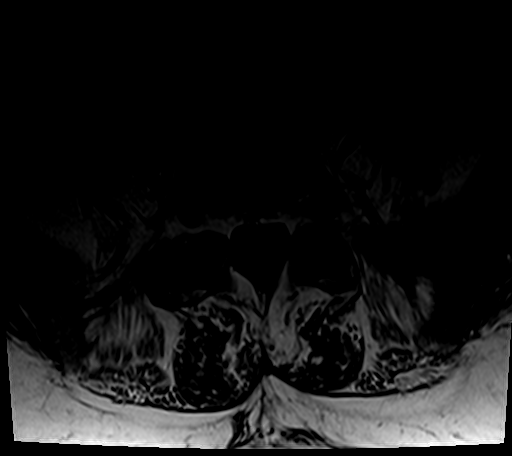
[im 19/35]
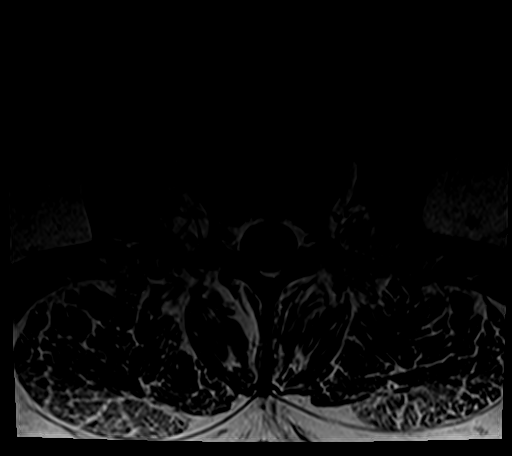
[im 29/35]
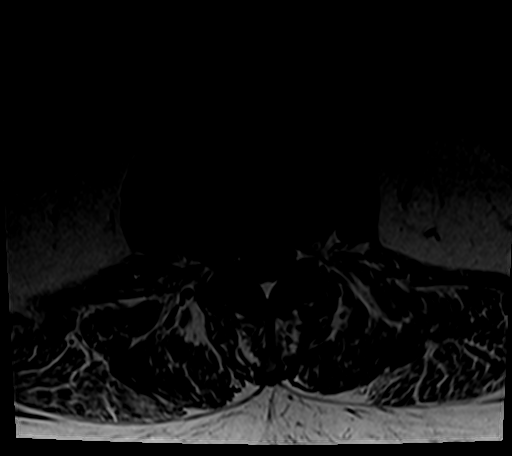

[Series 9: T2 · axial · 4.0mm · 0.70mm/px · z∈[-125,+95]mm · 8 of 35 slices shown]
[im 1/35]
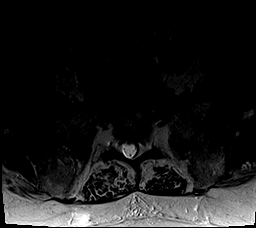
[im 6/35]
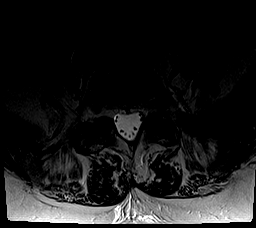
[im 11/35]
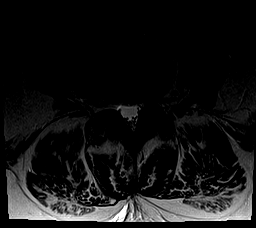
[im 16/35]
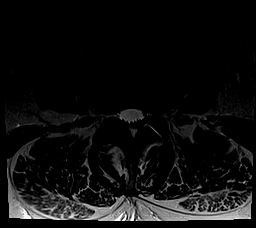
[im 19/35]
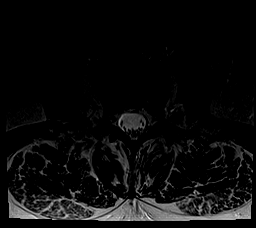
[im 24/35]
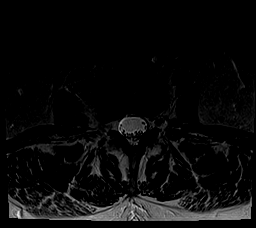
[im 29/35]
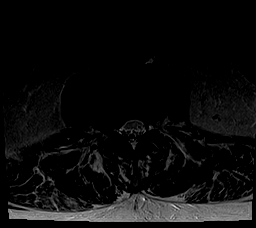
[im 35/35]
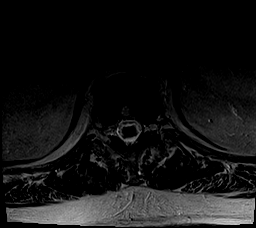

[25 of 48 positions shown; findings below may reference images not displayed]

FINDINGS: Segmentation:  Standard based on the lowest ribs

Alignment:  Physiologic

Vertebrae: There is soft tissue edematous signal about a bulky right
far-lateral osteophyte at the L1-2 disc space. No acute fracture,
discitis, or aggressive bone lesion. Scattered hemangiomas and other
benign appearing areas of marrow heterogeneity. Spurs and ankylosis
of the bilateral sacroiliac joints.

Conus medullaris and cauda equina: Conus extends to the L1-2 level.
Conus and cauda equina appear normal.

Paraspinal and other soft tissues: Negative

Disc levels:

T11-12 ligamentum flavum thickening without visible cord deformity.

T12- L1: Degenerative facet spurring.  No impingement

L1-L2: Bulky right far-lateral endplate spur with edematous signal
on FLAIR imaging. Right foraminal protrusion effacing the inferior
foramen without visible L1 compression. Patent canal

L2-L3: Right far-lateral bulky spondylitic spurring with bridging.
No visible impingement

L3-L4: Spondylosis and mild annulus bulging. Hypertrophic right
facet. No visible impingement

L4-L5: Spondylosis with disc narrowing and bulging. Degenerative
facet hypertrophy. No visible impingement

L5-S1:Disc narrowing with left foraminal protrusion. Left foraminal
stenosis is moderate. Degenerative facet spurring.
IMPRESSION: 1. Bulky right far-lateral endplate spurs at L1-2 and L2-3. There is
superimposed edematous signal at L1-2 and bridging at L2-3.
2. Generalized mild to moderate disc degeneration. Disc height loss
and bulge causes moderate left foraminal narrowing at L5-S1.
3. Degenerative facet arthropathy mainly at L4-5 and L5-S1.
4. Sacroiliac ankylosis bilaterally, likely degenerative given
associated spurring.
